# Patient Record
Sex: Male | Born: 1995 | Race: White | Hispanic: No | Marital: Married | Smoking: Never smoker
Health system: Southern US, Community
[De-identification: ages and names within clinical notes are randomized; demographics above are authoritative.]

## PROBLEM LIST (undated history)

## (undated) DIAGNOSIS — K219 Gastro-esophageal reflux disease without esophagitis: Secondary | ICD-10-CM

## (undated) DIAGNOSIS — Z973 Presence of spectacles and contact lenses: Secondary | ICD-10-CM

## (undated) HISTORY — PX: WISDOM TOOTH EXTRACTION: SHX21

---
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
## 2015-04-25 ENCOUNTER — Emergency Department
Admission: EM | Admit: 2015-04-25 | Discharge: 2015-04-26 | Disposition: A | Discharge status: Other Acute Inpatient Hospital | Payer: Medicaid Other | Attending: Emergency Medicine | Admitting: Emergency Medicine

## 2015-04-25 DIAGNOSIS — T2010XA Burn of first degree of head, face, and neck, unspecified site, initial encounter: Secondary | ICD-10-CM | POA: Diagnosis not present

## 2015-04-25 DIAGNOSIS — Y9389 Activity, other specified: Secondary | ICD-10-CM | POA: Diagnosis not present

## 2015-04-25 DIAGNOSIS — Y9289 Other specified places as the place of occurrence of the external cause: Secondary | ICD-10-CM | POA: Diagnosis not present

## 2015-04-25 DIAGNOSIS — Y998 Other external cause status: Secondary | ICD-10-CM | POA: Diagnosis not present

## 2015-04-25 DIAGNOSIS — T24232A Burn of second degree of left lower leg, initial encounter: Secondary | ICD-10-CM | POA: Insufficient documentation

## 2015-04-25 DIAGNOSIS — Z23 Encounter for immunization: Secondary | ICD-10-CM | POA: Diagnosis not present

## 2015-04-25 DIAGNOSIS — X04XXXA Exposure to ignition of highly flammable material, initial encounter: Secondary | ICD-10-CM | POA: Diagnosis not present

## 2015-04-25 DIAGNOSIS — T3110 Burns involving 10-19% of body surface with 0% to 9% third degree burns: Secondary | ICD-10-CM | POA: Insufficient documentation

## 2015-04-25 DIAGNOSIS — T22232A Burn of second degree of left upper arm, initial encounter: Secondary | ICD-10-CM | POA: Insufficient documentation

## 2015-04-25 DIAGNOSIS — T22032A Burn of unspecified degree of left upper arm, initial encounter: Secondary | ICD-10-CM | POA: Diagnosis present

## 2015-04-25 MED ORDER — ONDANSETRON HCL 4 MG/2ML IJ SOLN
4.0000 mg | Freq: Once | INTRAMUSCULAR | Status: AC
  Administered 2015-04-25: 4 mg via INTRAVENOUS

## 2015-04-25 MED ORDER — ONDANSETRON HCL 4 MG/2ML IJ SOLN
INTRAMUSCULAR | Status: AC
  Administered 2015-04-25: 4 mg via INTRAVENOUS
  Filled 2015-04-25: qty 2

## 2015-04-25 MED ORDER — TETANUS-DIPHTH-ACELL PERTUSSIS 5-2.5-18.5 LF-MCG/0.5 IM SUSP
0.5000 mL | Freq: Once | INTRAMUSCULAR | Status: AC
  Administered 2015-04-25: 0.5 mL via INTRAMUSCULAR

## 2015-04-25 MED ORDER — MORPHINE SULFATE 2 MG/ML IJ SOLN
INTRAMUSCULAR | Status: AC
  Filled 2015-04-25: qty 2

## 2015-04-25 MED ORDER — HYDROMORPHONE HCL 1 MG/ML IJ SOLN
0.5000 mg | Freq: Once | INTRAMUSCULAR | Status: AC
  Administered 2015-04-25: 0.5 mg via INTRAVENOUS

## 2015-04-25 MED ORDER — HYDROMORPHONE HCL 1 MG/ML IJ SOLN
INTRAMUSCULAR | Status: AC
  Filled 2015-04-25: qty 1

## 2015-04-25 MED ORDER — MORPHINE SULFATE 4 MG/ML IJ SOLN
4.0000 mg | Freq: Once | INTRAMUSCULAR | Status: AC
  Administered 2015-04-25: 4 mg via INTRAVENOUS

## 2015-04-25 MED ORDER — SILVER SULFADIAZINE 1 % EX CREA
TOPICAL_CREAM | CUTANEOUS | Status: AC
  Administered 2015-04-25: 1 via TOPICAL
  Filled 2015-04-25: qty 85

## 2015-04-25 MED ORDER — TETANUS-DIPHTH-ACELL PERTUSSIS 5-2.5-18.5 LF-MCG/0.5 IM SUSP
INTRAMUSCULAR | Status: AC
  Filled 2015-04-25: qty 0.5

## 2015-04-25 MED ORDER — SILVER SULFADIAZINE 1 % EX CREA
TOPICAL_CREAM | Freq: Once | CUTANEOUS | Status: AC
  Administered 2015-04-25: 1 via TOPICAL
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
## 2015-04-25 NOTE — ED Notes (Signed)
Pt now has redness to cheeks and neck. Pt states tender to touch.
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
## 2015-04-25 NOTE — ED Notes (Signed)
Informed by Juliann Pulse ed secretary, [facility] will not be able to transport pt at this time, [facility] transport from [place] coming to pick pt up.

## 2015-04-25 NOTE — ED Notes (Signed)
Pt reports he is now feeling some soreness in his throat. Acuity changed, charge RN aware.
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
## 2015-05-05 DIAGNOSIS — T2121XA Burn of second degree of chest wall, initial encounter: Secondary | ICD-10-CM | POA: Insufficient documentation

## 2015-05-05 DIAGNOSIS — T22232A Burn of second degree of left upper arm, initial encounter: Secondary | ICD-10-CM | POA: Insufficient documentation

## 2015-05-05 DIAGNOSIS — T24232A Burn of second degree of left lower leg, initial encounter: Secondary | ICD-10-CM | POA: Insufficient documentation

## 2016-02-24 ENCOUNTER — Ambulatory Visit (INDEPENDENT_AMBULATORY_CARE_PROVIDER_SITE_OTHER): Payer: Self-pay | Admitting: Family Medicine

## 2016-02-24 ENCOUNTER — Encounter: Payer: Self-pay | Admitting: Family Medicine

## 2016-02-24 VITALS — BP 110/84 | HR 86 | Ht 73.0 in | Wt 250.0 lb

## 2016-02-24 DIAGNOSIS — W57XXXA Bitten or stung by nonvenomous insect and other nonvenomous arthropods, initial encounter: Secondary | ICD-10-CM

## 2016-02-24 DIAGNOSIS — L03114 Cellulitis of left upper limb: Secondary | ICD-10-CM

## 2016-02-24 DIAGNOSIS — T148 Other injury of unspecified body region: Secondary | ICD-10-CM

## 2016-02-24 MED ORDER — SULFAMETHOXAZOLE-TRIMETHOPRIM 800-160 MG PO TABS: 1.0000 | ORAL_TABLET | Freq: Two times a day (BID) | ORAL | Status: DC

## 2016-02-24 MED ORDER — DOXYCYCLINE HYCLATE 100 MG PO TABS: 100.0000 mg | ORAL_TABLET | Freq: Two times a day (BID) | ORAL | Status: DC

## 2016-02-24 NOTE — Addendum Note (Signed)
Addended by: Fredderick Severance on: 02/24/2016 04:24 PM   Modules accepted: Orders, Medications

## 2016-02-24 NOTE — Patient Instructions (Signed)
Community-Associated MRSA CA-MRSA stands for community-associated methicillin-resistant Staphylococcus aureus. MRSA is a type of bacteria that is resistant to some common antibiotic medicines. It can cause infections in the skin and many other places in the body. Staphylococcus aureus, which is often called staph, is a bacterium that normally lives on the skin or in the nose of some people. Staph on the surface of the skin or in the nose does not cause problems. However, if the staph enters the body through a cut, wound, or break in the skin, an infection can happen. Until recently, infections with the MRSA type of staph occurred mainly in hospitals and other health care settings. Now there are increasing problems with MRSA infections in the community as well. Infections with MRSA may be very serious or even life-threatening. CAUSES  All staph bacteria, including MRSA:  Are normally harmless unless they enter the body through a scratch, cut, or wound, such as with surgery.  Can be spread from person to person by touching contaminated objects or through direct contact.  Cases of MRSA diseases in the community have been associated with:  Recent antibiotic use.  Sharing contaminated towels or clothes.  Having active skin diseases.  Participating in contact sports.  Living in crowded settings.  IV drug use.  Community-associated MRSA infections are usually skin infections, but they may cause other severe illnesses, such as:  Pneumonia.  Bone or joint infections.  Bloodstream infections (sepsis). SYMPTOMS This condition usually starts with a skin infection. Signs and symptoms may vary and can include:  An infected area of skin that is red and swollen, feels painful, and is warm to the touch.  Pus under the skin or pus draining from the infected area.  Fever. DIAGNOSIS This condition is diagnosed by cultures of fluid samples that may come from:  Swabs that are taken from cuts or  wounds in infected areas.  Nasal swabs.  Saliva or deep-cough specimens from the lungs (sputum).  Urine.  Blood. TREATMENT  Treatment varies and is based on how serious, how deep, or how extensive the infection is. For example:  Some skin infections, such as a small boil or abscess, may be treated by draining pus from the site of the infection.  Deeper or more widespread soft tissue infections are usually treated with surgery to drain pus and with antibiotics that are given through a vein or by mouth. This may be recommended even if you are pregnant.  Serious infections may require a hospital stay. If antibiotics are prescribed, they may be needed for several weeks. HOME CARE INSTRUCTIONS  Take your antibiotics as directed by your health care provider. Finish the antibiotic even if you start to feel better.  Avoid close contact with people around you as much as possible. Do not use towels, razors, toothbrushes, bedding, or other items that will be used by others.  To fight the infection, follow your health care provider's instructions for wound care. Wash your hands before and after changing your bandages (dressings).  If you have an intravascular device, such as a catheter or port, make sure that you know how to care for it. This might include:  Keeping the skin around the area clean.  Avoiding having your blood pressure taken on the side where the catheter is located.  Knowing when to report any suspected problems to your health care provider.  Be sure to tell any health care providers who care for you that you have MRSA so they are aware of your   infection. PREVENTION  Wash your hands frequently with soap and water for at least 15 seconds. If soap and water are not available, use alcohol-based hand disinfectants.  Make sure that people who live with you wash their hands often, too.  Do not share personal items. For example, avoid sharing razors and other personal hygiene  items, towels, clothing, and athletic equipment.  Wash and dry your clothes and bedding at the warmest temperatures that are recommended on the labels.  Keep wounds covered. Pus from infected sores may contain MRSA and other bacteria. Keep cuts and abrasions clean and covered with germ-free (sterile), dry bandages until they are healed.  If you have a wound that appears infected, ask your health care provider if a culture should be done for MRSA and other bacteria.  If you are breastfeeding, talk to your health care provider about MRSA. You may be asked to temporarily stop breastfeeding. SEEK IMMEDIATE MEDICAL CARE IF:  The infection appears to be getting worse. Signs include:  Increased warmth, redness, or tenderness around the wound site.  A red line that extends from the infection site.  A dark color in the area around the infection.  Wound drainage that is tan, yellow, or green.  A bad smell coming from the wound.  You feel nauseous, you vomit, or you cannot keep medicine down.  You have a fever.  You have difficulty breathing.   This information is not intended to replace advice given to you by your health care provider. Make sure you discuss any questions you have with your health care provider.   Document Released: 02/23/2006 Document Revised: 12/11/2014 Document Reviewed: 02/23/2011 Elsevier Interactive Patient Education 2016 [employer] Inc[employer]  

## 2016-02-24 NOTE — Progress Notes (Signed)
Name: Ryan Pennington   MRN: [id number]    DOB: 1996/09/17   Date:02/24/2016       Progress Note  Subjective  Chief Complaint  Chief Complaint  Patient presents with  . Insect Bite    working outside in high grass x 3 days ago- something bit or stung him- has since gotten worse by swelling and burning     Animal Bite  The incident occurred more than 2 days ago. The incident occurred at work. The pain is mild. Pertinent negatives include no chest pain, no abdominal pain, no nausea, no headaches, no neck pain, no focal weakness, no tingling and no cough.  Rash This is a new problem. The current episode started in the past 7 days. The problem has been gradually worsening since onset. The affected locations include the left arm. The rash is characterized by itchiness, redness and swelling. He was exposed to an insect bite/sting. Pertinent negatives include no cough, diarrhea, fever, joint pain, shortness of breath or sore throat. Past treatments include antibiotic cream. The treatment provided no relief.    No problem-specific assessment & plan notes found for this encounter.   History reviewed. No pertinent past medical history.  History reviewed. No pertinent past surgical history.  Family History  Problem Relation Age of Onset  . Diabetes Mother   . Diabetes Brother   . Hypertension Paternal Grandmother   . Diabetes Paternal Grandfather   . Heart disease Paternal Grandfather   . Hypertension Paternal Grandfather     Social History   Social History  . Marital Status: Single    Spouse Name: N/A  . Number of Children: N/A  . Years of Education: N/A   Occupational History  . Not on file.   Social History Main Topics  . Smoking status: Never Smoker   . Smokeless tobacco: Never Used  . Alcohol Use: No  . Drug Use: No  . Sexual Activity: No   Other Topics Concern  . Not on file   Social History Narrative    No Known Allergies   Review of Systems  Constitutional:  Negative for fever, chills, weight loss and malaise/fatigue.  HENT: Negative for ear discharge, ear pain and sore throat.   Eyes: Negative for blurred vision.  Respiratory: Negative for cough, sputum production, shortness of breath and wheezing.   Cardiovascular: Negative for chest pain, palpitations and leg swelling.  Gastrointestinal: Negative for heartburn, nausea, abdominal pain, diarrhea, constipation, blood in stool and melena.  Genitourinary: Negative for dysuria, urgency, frequency and hematuria.  Musculoskeletal: Negative for myalgias, back pain, joint pain and neck pain.  Skin: Positive for itching and rash.  Neurological: Negative for dizziness, tingling, sensory change, focal weakness and headaches.  Endo/Heme/Allergies: Negative for environmental allergies and polydipsia. Does not bruise/bleed easily.  Psychiatric/Behavioral: Negative for depression and suicidal ideas. The patient is not nervous/anxious and does not have insomnia.      Objective  Filed Vitals:   02/24/16 0758  BP: 110/84  Pulse: 86  Height: 6\' 1"  (1.854 m)  Weight: 250 lb (113.399 kg)    Physical Exam  Constitutional: He is oriented to person, place, and time and well-developed, well-nourished, and in no distress.  HENT:  Head: Normocephalic.  Right Ear: External ear normal.  Left Ear: External ear normal.  Nose: Nose normal.  Mouth/Throat: Oropharynx is clear and moist.  Eyes: Conjunctivae and EOM are normal. Pupils are equal, round, and reactive to light. Right eye exhibits no discharge. Left eye  exhibits no discharge. No scleral icterus.  Neck: Normal range of motion. Neck supple. No JVD present. No tracheal deviation present. No thyromegaly present.  Cardiovascular: Normal rate, regular rhythm, normal heart sounds and intact distal pulses.  Exam reveals no gallop and no friction rub.   No murmur heard. Pulmonary/Chest: Breath sounds normal. No respiratory distress. He has no wheezes. He has no  rales.  Abdominal: Soft. Bowel sounds are normal. He exhibits no mass. There is no hepatosplenomegaly. There is no tenderness. There is no rebound, no guarding and no CVA tenderness.  Musculoskeletal: Normal range of motion. He exhibits no tenderness.  Lymphadenopathy:    He has no cervical adenopathy.  Neurological: He is alert and oriented to person, place, and time. He has normal sensation, normal strength and intact cranial nerves. No cranial nerve deficit.  Skin: Skin is warm. Rash noted. Rash is macular and pustular. There is erythema.  Psychiatric: Mood and affect normal.  Nursing note and vitals reviewed.     Assessment & Plan  Problem List Items Addressed This Visit    None    Visit Diagnoses    Insect bite    -  Primary    Relevant Medications    sulfamethoxazole-trimethoprim (BACTRIM DS,SEPTRA DS) 800-160 MG tablet    Cellulitis of left upper extremity        Relevant Medications    sulfamethoxazole-trimethoprim (BACTRIM DS,SEPTRA DS) 800-160 MG tablet         Dr. Wilian Kwong [facility]  02/24/2016

## 2017-03-01 ENCOUNTER — Telehealth: Payer: Self-pay

## 2017-03-01 ENCOUNTER — Ambulatory Visit (INDEPENDENT_AMBULATORY_CARE_PROVIDER_SITE_OTHER): Payer: Medicaid Other | Admitting: Family Medicine

## 2017-03-01 ENCOUNTER — Encounter: Payer: Self-pay | Admitting: Family Medicine

## 2017-03-01 ENCOUNTER — Other Ambulatory Visit: Payer: Self-pay

## 2017-03-01 VITALS — BP 110/70 | HR 80 | Ht 73.0 in | Wt 264.0 lb

## 2017-03-01 DIAGNOSIS — K922 Gastrointestinal hemorrhage, unspecified: Secondary | ICD-10-CM

## 2017-03-01 DIAGNOSIS — K625 Hemorrhage of anus and rectum: Secondary | ICD-10-CM

## 2017-03-01 MED ORDER — PEG 3350-KCL-NA BICARB-NACL 420 G PO SOLR: 4000.0000 mL | Freq: Once | ORAL | 0 refills | Status: AC

## 2017-03-01 NOTE — Telephone Encounter (Signed)
Gastroenterology Pre-Procedure Review  Request Date: 03/09/17 Requesting Physician: Dr. Allen Norris  PATIENT REVIEW QUESTIONS: The patient responded to the following health history questions as indicated:    1. Are you having any GI issues? Rectal bleeding 2. Do you have a personal history of Polyps? no 3. Do you have a family history of Colon Cancer or Polyps? yes (great-grandfather colon cancer) 4. Diabetes Mellitus? no 5. Joint replacements in the past 12 months?no 6. Major health problems in the past 3 months?no 7. Any artificial heart valves, MVP, or defibrillator?no    MEDICATIONS & ALLERGIES:    Patient reports the following regarding taking any anticoagulation/antiplatelet therapy:   Plavix, Coumadin, Eliquis, Xarelto, Lovenox, Pradaxa, Brilinta, or Effient? no Aspirin? no  Patient confirms/reports the following medications:  No current outpatient prescriptions on file.   No current facility-administered medications for this visit.     Patient confirms/reports the following allergies:  Allergies  Allergen Reactions  . Sulfa Antibiotics     No orders of the defined types were placed in this encounter.   AUTHORIZATION INFORMATION Primary Insurance: 1D#: Group #:  Secondary Insurance: 1D#: Group #:  SCHEDULE INFORMATION: Date: 03/09/17 Time: Location: [place]

## 2017-03-01 NOTE — Progress Notes (Signed)
Name: Ryan Pennington   MRN: [id number]    DOB: 03-Aug-1996   Date:03/01/2017       Progress Note  Subjective  Chief Complaint  Chief Complaint  Patient presents with  . Gastroesophageal Reflux    something triggers gag reflex and he will vomit- forceful vomitting- makes him "feel weak afterwards"  . Rectal Bleeding    started approx 9 months ago- will be sitting or standing and "feel the blood come out" have noticed on toilet paper after a BM- no change in bowel habits. has happened 3 times this week    Gastroesophageal Reflux  He complains of abdominal pain. He reports no belching, no chest pain, no choking, no coughing, no dysphagia, no early satiety, no globus sensation, no heartburn, no hoarse voice, no nausea, no sore throat, no stridor, no tooth decay, no water brash or no wheezing. This is a new problem. The current episode started in the past 7 days. The problem occurs occasionally. The problem has been waxing and waning. Exacerbated by: brushing. Pertinent negatives include no anemia, fatigue, melena, muscle weakness, orthopnea or weight loss.  Rectal Bleeding   The current episode started more than 2 weeks ago (7 months ago). The onset was gradual. The problem has been unchanged. The pain is mild. The stool is described as mixed with blood. Associated symptoms include abdominal pain. Pertinent negatives include no fever, no diarrhea, no hemorrhoids, no nausea, no rectal pain, no hematuria, no chest pain, no headaches, no coughing and no rash.  Abdominal Pain  This is a chronic problem. The current episode started more than 1 month ago. The onset quality is gradual. The problem occurs intermittently. The problem has been waxing and waning. The pain is located in the LLQ. The pain is mild. The abdominal pain does not radiate. Associated symptoms include hematochezia. Pertinent negatives include no belching, constipation, diarrhea, dysuria, fever, frequency, headaches, hematuria, melena,  myalgias, nausea or weight loss. The pain is relieved by nothing. He has tried nothing for the symptoms. His past medical history is significant for GERD.    No problem-specific Assessment & Plan notes found for this encounter.   No past medical history on file.  No past surgical history on file.  Family History  Problem Relation Age of Onset  . Diabetes Mother   . Diabetes Brother   . Hypertension Paternal Grandmother   . Diabetes Paternal Grandfather   . Heart disease Paternal Grandfather   . Hypertension Paternal Grandfather     Social History   Social History  . Marital status: Single    Spouse name: N/A  . Number of children: N/A  . Years of education: N/A   Occupational History  . Not on file.   Social History Main Topics  . Smoking status: Never Smoker  . Smokeless tobacco: Never Used  . Alcohol use No  . Drug use: No  . Sexual activity: No   Other Topics Concern  . Not on file   Social History Narrative  . No narrative on file    Allergies  Allergen Reactions  . Sulfa Antibiotics     Outpatient Medications Prior to Visit  Medication Sig Dispense Refill  . doxycycline (VIBRA-TABS) 100 MG tablet Take 1 tablet (100 mg total) by mouth 2 (two) times daily. 20 tablet 0   No facility-administered medications prior to visit.     Review of Systems  Constitutional: Negative for chills, fatigue, fever, malaise/fatigue and weight loss.  HENT: Negative for  ear discharge, ear pain, hoarse voice and sore throat.   Eyes: Negative for blurred vision.  Respiratory: Negative for cough, sputum production, choking, shortness of breath and wheezing.   Cardiovascular: Negative for chest pain, palpitations and leg swelling.  Gastrointestinal: Positive for abdominal pain, blood in stool and hematochezia. Negative for constipation, diarrhea, dysphagia, heartburn, hemorrhoids, melena, nausea and rectal pain.  Genitourinary: Negative for dysuria, frequency, hematuria and  urgency.  Musculoskeletal: Negative for back pain, joint pain, myalgias, muscle weakness and neck pain.  Skin: Negative for rash.  Neurological: Negative for dizziness, tingling, sensory change, focal weakness and headaches.  Endo/Heme/Allergies: Negative for environmental allergies and polydipsia. Does not bruise/bleed easily.  Psychiatric/Behavioral: Negative for depression and suicidal ideas. The patient is not nervous/anxious and does not have insomnia.      Objective  Vitals:   03/01/17 1508  BP: 110/70  Pulse: 80  Weight: 264 lb (119.7 kg)  Height: 6\' 1"  (1.854 m)    Physical Exam  Constitutional: He is oriented to person, place, and time and well-developed, well-nourished, and in no distress.  HENT:  Head: Normocephalic.  Right Ear: External ear normal.  Left Ear: External ear normal.  Nose: Nose normal.  Mouth/Throat: Oropharynx is clear and moist.  Eyes: Conjunctivae and EOM are normal. Pupils are equal, round, and reactive to light. Right eye exhibits no discharge. Left eye exhibits no discharge. No scleral icterus.  Neck: Normal range of motion. Neck supple. No JVD present. No tracheal deviation present. No thyromegaly present.  Cardiovascular: Normal rate, regular rhythm, normal heart sounds and intact distal pulses.  Exam reveals no gallop and no friction rub.   No murmur heard. Pulmonary/Chest: Breath sounds normal. No respiratory distress. He has no wheezes. He has no rales.  Abdominal: Soft. Bowel sounds are normal. He exhibits no mass. There is no hepatosplenomegaly. There is no tenderness. There is no rebound, no guarding and no CVA tenderness.  Genitourinary: Prostate normal. Rectal exam shows guaiac positive stool.  Musculoskeletal: Normal range of motion. He exhibits no edema or tenderness.  Lymphadenopathy:    He has no cervical adenopathy.  Neurological: He is alert and oriented to person, place, and time. He has normal sensation, normal strength, normal  reflexes and intact cranial nerves. No cranial nerve deficit.  Skin: Skin is warm. No rash noted.  Psychiatric: Mood and affect normal.  Nursing note and vitals reviewed.     Assessment & Plan  Problem List Items Addressed This Visit    None    Visit Diagnoses    Lower GI bleed    -  Primary   Relevant Orders   Ambulatory referral to Gastroenterology   CBC w/Diff/Platelet      No orders of the defined types were placed in this encounter.     Dr. [facility]  03/01/17

## 2017-03-02 LAB — CBC WITH DIFFERENTIAL/PLATELET
BASOS ABS: 0 10*3/uL (ref 0.0–0.2)
Basos: 0 %
EOS (ABSOLUTE): 0.2 10*3/uL (ref 0.0–0.4)
Eos: 2 %
Hematocrit: 45.2 % (ref 37.5–51.0)
Hemoglobin: 15.6 g/dL (ref 13.0–17.7)
IMMATURE GRANS (ABS): 0.1 10*3/uL (ref 0.0–0.1)
Immature Granulocytes: 1 %
LYMPHS: 34 %
Lymphocytes Absolute: 3.6 10*3/uL — ABNORMAL HIGH (ref 0.7–3.1)
MCH: 29.3 pg (ref 26.6–33.0)
MCHC: 34.5 g/dL (ref 31.5–35.7)
MCV: 85 fL (ref 79–97)
MONOS ABS: 0.8 10*3/uL (ref 0.1–0.9)
Monocytes: 7 %
NEUTROS ABS: 6 10*3/uL (ref 1.4–7.0)
NEUTROS PCT: 56 %
PLATELETS: 277 10*3/uL (ref 150–379)
RBC: 5.32 x10E6/uL (ref 4.14–5.80)
RDW: 13 % (ref 12.3–15.4)
WBC: 10.8 10*3/uL (ref 3.4–10.8)

## 2017-03-05 ENCOUNTER — Encounter: Payer: Self-pay | Admitting: *Deleted

## 2017-03-05 NOTE — Discharge Instructions (Signed)

## 2017-03-09 ENCOUNTER — Ambulatory Visit: Payer: Medicaid Other | Admitting: Anesthesiology

## 2017-03-09 ENCOUNTER — Ambulatory Visit
Admission: RE | Admit: 2017-03-09 | Discharge: 2017-03-09 | Disposition: A | Discharge status: Home / Self Care | Payer: Medicaid Other | Source: Ambulatory Visit | Attending: Gastroenterology | Admitting: Gastroenterology

## 2017-03-09 ENCOUNTER — Encounter
Admission: RE | Disposition: A | Discharge status: Home / Self Care | Payer: Self-pay | Source: Ambulatory Visit | Attending: Gastroenterology

## 2017-03-09 DIAGNOSIS — K921 Melena: Secondary | ICD-10-CM

## 2017-03-09 DIAGNOSIS — K219 Gastro-esophageal reflux disease without esophagitis: Secondary | ICD-10-CM | POA: Insufficient documentation

## 2017-03-09 DIAGNOSIS — K640 First degree hemorrhoids: Secondary | ICD-10-CM | POA: Diagnosis not present

## 2017-03-09 DIAGNOSIS — K602 Anal fissure, unspecified: Secondary | ICD-10-CM | POA: Diagnosis not present

## 2017-03-09 HISTORY — DX: Gastro-esophageal reflux disease without esophagitis: K21.9

## 2017-03-09 HISTORY — PX: COLONOSCOPY WITH PROPOFOL: SHX5780

## 2017-03-09 HISTORY — DX: Presence of spectacles and contact lenses: Z97.3

## 2017-03-09 SURGERY — COLONOSCOPY WITH PROPOFOL
Anesthesia: Monitor Anesthesia Care | Wound class: Contaminated

## 2017-03-09 MED ORDER — STERILE WATER FOR IRRIGATION IR SOLN
Status: DC | PRN
  Administered 2017-03-09: 08:00:00

## 2017-03-09 MED ORDER — OXYCODONE HCL 5 MG PO TABS: 5.0000 mg | ORAL_TABLET | Freq: Once | ORAL | Status: DC | PRN

## 2017-03-09 MED ORDER — LIDOCAINE HCL (CARDIAC) 20 MG/ML IV SOLN
INTRAVENOUS | Status: DC | PRN
  Administered 2017-03-09: 40 mg via INTRAVENOUS

## 2017-03-09 MED ORDER — HYDROCORTISONE ACETATE 25 MG RE SUPP: 25.0000 mg | Freq: Every day | RECTAL | 0 refills | Status: AC

## 2017-03-09 MED ORDER — ACETAMINOPHEN 160 MG/5ML PO SOLN: 325.0000 mg | ORAL | Status: DC | PRN

## 2017-03-09 MED ORDER — DEXAMETHASONE SODIUM PHOSPHATE 4 MG/ML IJ SOLN: 8.0000 mg | Freq: Once | INTRAMUSCULAR | Status: DC | PRN

## 2017-03-09 MED ORDER — LACTATED RINGERS IV SOLN
INTRAVENOUS | Status: DC
  Administered 2017-03-09: 07:00:00 via INTRAVENOUS

## 2017-03-09 MED ORDER — LACTATED RINGERS IV SOLN
500.0000 mL | INTRAVENOUS | Status: DC
Start: 2017-03-09 — End: 2017-03-09

## 2017-03-09 MED ORDER — PROPOFOL 10 MG/ML IV BOLUS
INTRAVENOUS | Status: DC | PRN
  Administered 2017-03-09: 50 mg via INTRAVENOUS
  Administered 2017-03-09: 100 mg via INTRAVENOUS
  Administered 2017-03-09 (×3): 50 mg via INTRAVENOUS
  Administered 2017-03-09: 20 mg via INTRAVENOUS
  Administered 2017-03-09: 50 mg via INTRAVENOUS

## 2017-03-09 MED ORDER — OXYCODONE HCL 5 MG/5ML PO SOLN: 5.0000 mg | Freq: Once | ORAL | Status: DC | PRN

## 2017-03-09 MED ORDER — ACETAMINOPHEN 325 MG PO TABS: 325.0000 mg | ORAL_TABLET | ORAL | Status: DC | PRN

## 2017-03-09 MED ORDER — FENTANYL CITRATE (PF) 100 MCG/2ML IJ SOLN: 25.0000 ug | INTRAMUSCULAR | Status: DC | PRN

## 2017-03-09 SURGICAL SUPPLY — 23 items

## 2017-03-09 NOTE — Anesthesia Postprocedure Evaluation (Signed)
Anesthesia Post Note  Patient: Ryan Pennington  Procedure(s) Performed: Procedure(s) (LRB): COLONOSCOPY WITH PROPOFOL (N/A)  Patient location during evaluation: PACU Anesthesia Type: MAC Level of consciousness: awake and alert Pain management: pain level controlled Vital Signs Assessment: post-procedure vital signs reviewed and stable Respiratory status: spontaneous breathing, nonlabored ventilation and respiratory function stable Cardiovascular status: stable and blood pressure returned to baseline Anesthetic complications: no    [name]

## 2017-03-09 NOTE — H&P (Signed)
  Lucilla Lame, MD [address], [place] [address] Phone:[phone] Fax : [phone]  Primary Care Physician:  Otilio Miu, MD Primary Gastroenterologist:  Dr. Allen Norris  Pre-Procedure History & Physical: HPI:  Ryan Pennington is a 21 y.o. male is here for an colonoscopy.   Past Medical History:  Diagnosis Date  . GERD (gastroesophageal reflux disease)   . Wears contact lenses     Past Surgical History:  Procedure Laterality Date  . WISDOM TOOTH EXTRACTION      Prior to Admission medications   Not on File    Allergies as of 03/01/2017 - Review Complete 03/01/2017  Allergen Reaction Noted  . Sulfa antibiotics  02/24/2016    Family History  Problem Relation Age of Onset  . Diabetes Mother   . Diabetes Brother   . Hypertension Paternal Grandmother   . Diabetes Paternal Grandfather   . Heart disease Paternal Grandfather   . Hypertension Paternal Grandfather     Social History   Social History  . Marital status: Single    Spouse name: N/A  . Number of children: N/A  . Years of education: N/A   Occupational History  . Not on file.   Social History Main Topics  . Smoking status: Never Smoker  . Smokeless tobacco: Never Used  . Alcohol use No  . Drug use: No  . Sexual activity: No   Other Topics Concern  . Not on file   Social History Narrative  . No narrative on file    Review of Systems: See HPI, otherwise negative ROS  Physical Exam: BP 134/83   Pulse 79   Temp 97.9 F (36.6 C) (Temporal)   Resp 16   Ht 6\' 1"  (1.854 m)   Wt 254 lb (115.2 kg)   SpO2 100%   BMI 33.51 kg/m  General:   Alert,  pleasant and cooperative in NAD Head:  Normocephalic and atraumatic. Neck:  Supple; no masses or thyromegaly. Lungs:  Clear throughout to auscultation.    Heart:  Regular rate and rhythm. Abdomen:  Soft, nontender and nondistended. Normal bowel sounds, without guarding, and without rebound.   Neurologic:  Alert and  oriented x4;   grossly normal neurologically.  Impression/Plan: Ryan Pennington is here for an colonoscopy to be performed for hematochezia  Risks, benefits, limitations, and alternatives regarding  colonoscopy have been reviewed with the patient.  Questions have been answered.  All parties agreeable.   Lucilla Lame, MD  03/09/2017, 7:28 AM

## 2017-03-09 NOTE — Anesthesia Procedure Notes (Signed)
Procedure Name: MAC Date/Time: 03/09/2017 7:33 AM Performed by: Janna Arch Pre-anesthesia Checklist: Patient identified, Emergency Drugs available, Suction available and Patient being monitored Patient Re-evaluated:Patient Re-evaluated prior to inductionOxygen Delivery Method: Nasal cannula

## 2017-03-09 NOTE — Op Note (Signed)
[facility] Gastroenterology Patient Name: Ryan Pennington Procedure Date: 03/09/2017 7:23 AM MRN: [id number] Account #: [id number] Date of Birth: 1996-06-27 Admit Type: Outpatient Age: 21 Room: [facility] OR ROOM 01 Gender: Male Note Status: Finalized Procedure:            Colonoscopy Indications:          Hematochezia Providers:            Lucilla Lame MD, MD Referring MD:         Juline Patch, MD (Referring MD) Medicines:            Propofol per Anesthesia Complications:        No immediate complications. Procedure:            Pre-Anesthesia Assessment:                       - Prior to the procedure, a History and Physical was                        performed, and patient medications and allergies were                        reviewed. The patient's tolerance of previous                        anesthesia was also reviewed. The risks and benefits of                        the procedure and the sedation options and risks were                        discussed with the patient. All questions were                        answered, and informed consent was obtained. Prior                        Anticoagulants: The patient has taken no previous                        anticoagulant or antiplatelet agents. ASA Grade                        Assessment: II - A patient with mild systemic disease.                        After reviewing the risks and benefits, the patient was                        deemed in satisfactory condition to undergo the                        procedure.                       After obtaining informed consent, the colonoscope was                        passed under direct vision. Throughout the procedure,  the patient's blood pressure, pulse, and oxygen                        saturations were monitored continuously. The Olympus CF                        H180AL Colonoscope (S#: [id number]) was introduced through                        the  anus and advanced to the the cecum, identified by                        appendiceal orifice and ileocecal valve. The                        colonoscopy was performed without difficulty. The                        patient tolerated the procedure well. The quality of                        the bowel preparation was excellent. Findings:      The perianal exam findings include anal fissure.      Non-bleeding internal hemorrhoids were found during retroflexion. The       hemorrhoids were Grade I (internal hemorrhoids that do not prolapse).      The terminal ileum appeared normal. Impression:           - Anal fissure found on perianal exam.                       - Non-bleeding internal hemorrhoids.                       - The examined portion of the ileum was normal.                       - No specimens collected. Recommendation:       - Discharge patient to home.                       - Resume previous diet.                       - Continue present medications.                       - Use hydrocortisone suppository 25 mg 1 per rectum                        once a day. Procedure Code(s):    --- Professional ---                       [phone], Colonoscopy, flexible; diagnostic, including                        collection of specimen(s) by brushing or washing, when                        performed (separate procedure) Diagnosis Code(s):    --- Professional ---  K92.1, Melena (includes Hematochezia)                       K60.2, Anal fissure, unspecified CPT copyright 2016 American Medical Association. All rights reserved. The codes documented in this report are preliminary and upon coder review may  be revised to meet current compliance requirements. Lucilla Lame MD, MD 03/09/2017 7:51:37 AM This report has been signed electronically. Number of Addenda: 0 Note Initiated On: 03/09/2017 7:23 AM Scope Withdrawal Time: 0 hours 9 minutes 35 seconds  Total Procedure Duration: 0  hours 11 minutes 6 seconds       [facility]

## 2017-03-09 NOTE — Transfer of Care (Signed)
Immediate Anesthesia Transfer of Care Note  Patient: Ryan Pennington  Procedure(s) Performed: Procedure(s): COLONOSCOPY WITH PROPOFOL (N/A)  Patient Location: PACU  Anesthesia Type: MAC  Level of Consciousness: awake, alert  and patient cooperative  Airway and Oxygen Therapy: Patient Spontanous Breathing and Patient connected to supplemental oxygen  Post-op Assessment: Post-op Vital signs reviewed, Patient's Cardiovascular Status Stable, Respiratory Function Stable, Patent Airway and No signs of Nausea or vomiting  Post-op Vital Signs: Reviewed and stable  Complications: No apparent anesthesia complications

## 2017-03-09 NOTE — Anesthesia Preprocedure Evaluation (Signed)
Anesthesia Evaluation  Patient identified by MRN, date of birth, ID band Patient awake    Reviewed: Allergy & Precautions, H&P , NPO status , Patient's Chart, lab work & pertinent test results, reviewed documented beta blocker date and time   Airway Mallampati: III  TM Distance: >3 FB Neck ROM: full    Dental no notable dental hx.    Pulmonary neg pulmonary ROS,    Pulmonary exam normal breath sounds clear to auscultation       Cardiovascular Exercise Tolerance: Good negative cardio ROS   Rhythm:regular Rate:Normal     Neuro/Psych negative neurological ROS  negative psych ROS   GI/Hepatic Neg liver ROS, GERD  Medicated,  Endo/Other  negative endocrine ROS  Renal/GU negative Renal ROS  negative genitourinary   Musculoskeletal   Abdominal   Peds  Hematology negative hematology ROS (+)   Anesthesia Other Findings   Reproductive/Obstetrics negative OB ROS                             Anesthesia Physical Anesthesia Plan  ASA: II  Anesthesia Plan: MAC   Post-op Pain Management:    Induction:   Airway Management Planned:   Additional Equipment:   Intra-op Plan:   Post-operative Plan:   Informed Consent: I have reviewed the patients History and Physical, chart, labs and discussed the procedure including the risks, benefits and alternatives for the proposed anesthesia with the patient or authorized representative who has indicated his/her understanding and acceptance.     Plan Discussed with: CRNA  Anesthesia Plan Comments:         Anesthesia Quick Evaluation

## 2017-03-12 ENCOUNTER — Encounter: Payer: Self-pay | Admitting: Gastroenterology

## 2017-03-20 ENCOUNTER — Other Ambulatory Visit: Payer: Self-pay

## 2017-09-14 ENCOUNTER — Ambulatory Visit (INDEPENDENT_AMBULATORY_CARE_PROVIDER_SITE_OTHER): Payer: PRIVATE HEALTH INSURANCE

## 2017-09-14 DIAGNOSIS — Z23 Encounter for immunization: Secondary | ICD-10-CM | POA: Diagnosis not present

## 2017-12-12 ENCOUNTER — Ambulatory Visit
Admission: RE | Admit: 2017-12-12 | Discharge: 2017-12-12 | Disposition: A | Discharge status: Home / Self Care | Payer: BLUE CROSS/BLUE SHIELD | Source: Ambulatory Visit | Attending: Family Medicine | Admitting: Family Medicine

## 2017-12-12 ENCOUNTER — Other Ambulatory Visit: Payer: Self-pay | Admitting: Family Medicine

## 2017-12-12 DIAGNOSIS — S6991XA Unspecified injury of right wrist, hand and finger(s), initial encounter: Secondary | ICD-10-CM | POA: Insufficient documentation

## 2017-12-12 DIAGNOSIS — T1490XA Injury, unspecified, initial encounter: Secondary | ICD-10-CM

## 2017-12-12 DIAGNOSIS — W109XXA Fall (on) (from) unspecified stairs and steps, initial encounter: Secondary | ICD-10-CM | POA: Insufficient documentation

## 2017-12-12 DIAGNOSIS — W19XXXA Unspecified fall, initial encounter: Secondary | ICD-10-CM

## 2018-02-12 ENCOUNTER — Ambulatory Visit
Admission: EM | Admit: 2018-02-12 | Discharge: 2018-02-12 | Disposition: A | Discharge status: Home / Self Care | Payer: BLUE CROSS/BLUE SHIELD | Attending: Family Medicine | Admitting: Family Medicine

## 2018-02-12 ENCOUNTER — Other Ambulatory Visit: Payer: Self-pay

## 2018-02-12 DIAGNOSIS — S61412A Laceration without foreign body of left hand, initial encounter: Secondary | ICD-10-CM | POA: Diagnosis not present

## 2018-02-12 DIAGNOSIS — W268XXA Contact with other sharp object(s), not elsewhere classified, initial encounter: Secondary | ICD-10-CM | POA: Diagnosis not present
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
## 2018-10-17 ENCOUNTER — Telehealth: Payer: Self-pay

## 2018-10-17 ENCOUNTER — Other Ambulatory Visit: Payer: Self-pay

## 2018-10-17 MED ORDER — AMOXICILLIN 500 MG PO CAPS: 500.0000 mg | ORAL_CAPSULE | Freq: Three times a day (TID) | ORAL | 0 refills | Status: DC

## 2018-10-17 NOTE — Telephone Encounter (Signed)
Pt will come in on Monday- sent in Amox to [facility]

## 2018-10-21 ENCOUNTER — Ambulatory Visit: Payer: BLUE CROSS/BLUE SHIELD | Admitting: Family Medicine

## 2018-10-21 ENCOUNTER — Encounter: Payer: Self-pay | Admitting: Family Medicine

## 2018-10-21 VITALS — BP 136/76 | HR 80 | Ht 73.0 in | Wt 260.0 lb

## 2018-10-21 DIAGNOSIS — L600 Ingrowing nail: Secondary | ICD-10-CM | POA: Diagnosis not present
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
## 2019-01-28 DIAGNOSIS — L259 Unspecified contact dermatitis, unspecified cause: Secondary | ICD-10-CM | POA: Diagnosis not present

## 2019-01-28 DIAGNOSIS — H00012 Hordeolum externum right lower eyelid: Secondary | ICD-10-CM | POA: Diagnosis not present

## 2019-06-20 DIAGNOSIS — L03032 Cellulitis of left toe: Secondary | ICD-10-CM | POA: Diagnosis not present

## 2019-06-20 DIAGNOSIS — L03031 Cellulitis of right toe: Secondary | ICD-10-CM | POA: Diagnosis not present

## 2019-10-24 ENCOUNTER — Other Ambulatory Visit: Payer: Self-pay

## 2019-10-24 ENCOUNTER — Encounter: Payer: Self-pay | Admitting: Family Medicine

## 2019-10-24 ENCOUNTER — Ambulatory Visit (INDEPENDENT_AMBULATORY_CARE_PROVIDER_SITE_OTHER): Payer: BC Managed Care – PPO | Admitting: Family Medicine

## 2019-10-24 VITALS — BP 132/68 | HR 80 | Ht 73.0 in | Wt 283.0 lb

## 2019-10-24 DIAGNOSIS — I10 Essential (primary) hypertension: Secondary | ICD-10-CM | POA: Diagnosis not present

## 2019-10-24 MED ORDER — LISINOPRIL 10 MG PO TABS: 10.0000 mg | ORAL_TABLET | Freq: Every day | ORAL | 1 refills | Status: DC
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
## 2019-10-24 NOTE — Patient Instructions (Signed)

## 2019-11-21 ENCOUNTER — Other Ambulatory Visit: Payer: Self-pay | Admitting: Family Medicine

## 2019-11-21 DIAGNOSIS — I10 Essential (primary) hypertension: Secondary | ICD-10-CM

## 2020-04-23 ENCOUNTER — Ambulatory Visit (INDEPENDENT_AMBULATORY_CARE_PROVIDER_SITE_OTHER): Payer: BC Managed Care – PPO | Admitting: Family Medicine

## 2020-04-23 ENCOUNTER — Other Ambulatory Visit: Payer: Self-pay

## 2020-04-23 ENCOUNTER — Encounter: Payer: Self-pay | Admitting: Family Medicine

## 2020-04-23 VITALS — BP 128/68 | HR 77 | Ht 73.0 in | Wt 295.0 lb

## 2020-04-23 DIAGNOSIS — I10 Essential (primary) hypertension: Secondary | ICD-10-CM | POA: Diagnosis not present

## 2020-04-23 MED ORDER — LISINOPRIL 10 MG PO TABS: 10.0000 mg | ORAL_TABLET | Freq: Every day | ORAL | 1 refills | Status: DC
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
## 2020-04-24 LAB — RENAL FUNCTION PANEL
Albumin: 4.6 g/dL (ref 4.1–5.2)
BUN/Creatinine Ratio: 14 (ref 9–20)
BUN: 16 mg/dL (ref 6–20)
CO2: 23 mmol/L (ref 20–29)
Calcium: 9.5 mg/dL (ref 8.7–10.2)
Chloride: 100 mmol/L (ref 96–106)
Creatinine, Ser: 1.16 mg/dL (ref 0.76–1.27)
GFR calc Af Amer: 102 mL/min/{1.73_m2} (ref >59)
GFR calc non Af Amer: 88 mL/min/{1.73_m2} (ref >59)
Glucose: 94 mg/dL (ref 65–99)
Phosphorus: 3.6 mg/dL (ref 2.8–4.1)
Potassium: 4.5 mmol/L (ref 3.5–5.2)
Sodium: 137 mmol/L (ref 134–144)

## 2020-07-01 ENCOUNTER — Ambulatory Visit: Payer: Self-pay | Admitting: Family Medicine

## 2020-07-16 ENCOUNTER — Encounter: Payer: Self-pay | Admitting: Emergency Medicine

## 2020-07-16 ENCOUNTER — Ambulatory Visit
Admission: EM | Admit: 2020-07-16 | Discharge: 2020-07-16 | Disposition: A | Discharge status: Home / Self Care | Payer: BC Managed Care – PPO | Attending: Internal Medicine | Admitting: Internal Medicine

## 2020-07-16 ENCOUNTER — Other Ambulatory Visit: Payer: Self-pay

## 2020-07-16 DIAGNOSIS — R05 Cough: Secondary | ICD-10-CM

## 2020-07-16 DIAGNOSIS — U071 COVID-19: Secondary | ICD-10-CM | POA: Insufficient documentation

## 2020-07-16 DIAGNOSIS — Z20822 Contact with and (suspected) exposure to covid-19: Secondary | ICD-10-CM

## 2020-07-16 DIAGNOSIS — R058 Other specified cough: Secondary | ICD-10-CM

## 2020-07-16 MED ORDER — IBUPROFEN 600 MG PO TABS: 600.0000 mg | ORAL_TABLET | Freq: Four times a day (QID) | ORAL | 0 refills | Status: DC | PRN

## 2020-07-16 MED ORDER — BENZONATATE 100 MG PO CAPS: 100.0000 mg | ORAL_CAPSULE | Freq: Three times a day (TID) | ORAL | 0 refills | Status: DC | PRN

## 2020-07-16 NOTE — Discharge Instructions (Signed)
Please quarantine until COVID-19 test results are available Increase hydration Tylenol/Motrin as needed for pain If you start developing worsening shortness of breath, fever, persistent vomiting or changes in mentation please return to urgent care to be reevaluated.

## 2020-07-16 NOTE — ED Triage Notes (Signed)
Patient c/o cough, congestion, chills, HAs, that started on Monday and loss of taste and smell that started today.  Patient denies fevers.  Patient states that his wife tested positive for COVID today.

## 2020-07-17 LAB — SARS CORONAVIRUS 2 (TAT 6-24 HRS): SARS Coronavirus 2: POSITIVE — AB
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
## 2020-07-23 DIAGNOSIS — U071 COVID-19: Secondary | ICD-10-CM | POA: Diagnosis not present

## 2021-07-08 ENCOUNTER — Ambulatory Visit (INDEPENDENT_AMBULATORY_CARE_PROVIDER_SITE_OTHER): Payer: BC Managed Care – PPO | Admitting: Family Medicine

## 2021-07-08 ENCOUNTER — Other Ambulatory Visit: Payer: Self-pay

## 2021-07-08 ENCOUNTER — Other Ambulatory Visit
Admission: RE | Admit: 2021-07-08 | Discharge: 2021-07-08 | Disposition: A | Discharge status: Home / Self Care | Payer: BC Managed Care – PPO | Attending: Family Medicine | Admitting: Family Medicine

## 2021-07-08 ENCOUNTER — Encounter: Payer: Self-pay | Admitting: Family Medicine

## 2021-07-08 VITALS — BP 102/62 | HR 68 | Ht 73.0 in | Wt 279.0 lb

## 2021-07-08 DIAGNOSIS — M1990 Unspecified osteoarthritis, unspecified site: Secondary | ICD-10-CM | POA: Diagnosis not present

## 2021-07-08 DIAGNOSIS — Z6836 Body mass index (BMI) 36.0-36.9, adult: Secondary | ICD-10-CM | POA: Insufficient documentation

## 2021-07-08 DIAGNOSIS — I10 Essential (primary) hypertension: Secondary | ICD-10-CM

## 2021-07-08 DIAGNOSIS — E669 Obesity, unspecified: Secondary | ICD-10-CM | POA: Diagnosis not present

## 2021-07-08 DIAGNOSIS — R69 Illness, unspecified: Secondary | ICD-10-CM

## 2021-07-08 DIAGNOSIS — Z79899 Other long term (current) drug therapy: Secondary | ICD-10-CM | POA: Diagnosis not present

## 2021-07-08 LAB — RENAL FUNCTION PANEL
Albumin: 4.9 g/dL (ref 3.5–5.0)
Anion gap: 6 (ref 5–15)
BUN: 18 mg/dL (ref 6–20)
CO2: 28 mmol/L (ref 22–32)
Calcium: 9.2 mg/dL (ref 8.9–10.3)
Chloride: 98 mmol/L (ref 98–111)
Creatinine, Ser: 0.93 mg/dL (ref 0.61–1.24)
GFR, Estimated: >60 mL/min (ref >60)
Glucose, Bld: 101 mg/dL — ABNORMAL HIGH (ref 70–99)
Phosphorus: 3.7 mg/dL (ref 2.5–4.6)
Potassium: 4.6 mmol/L (ref 3.5–5.1)
Sodium: 132 mmol/L — ABNORMAL LOW (ref 135–145)

## 2021-07-08 LAB — LIPID PANEL
Cholesterol: 140 mg/dL (ref 0–200)
HDL: 47 mg/dL (ref >40)
LDL Cholesterol: 83 mg/dL (ref 0–99)
Total CHOL/HDL Ratio: 3 RATIO
Triglycerides: 52 mg/dL (ref <150)
VLDL: 10 mg/dL (ref 0–40)

## 2021-07-08 MED ORDER — LISINOPRIL 5 MG PO TABS: 5.0000 mg | ORAL_TABLET | Freq: Every day | ORAL | 1 refills | Status: DC

## 2021-07-08 MED ORDER — MELOXICAM 7.5 MG PO TABS: 7.5000 mg | ORAL_TABLET | Freq: Every day | ORAL | 2 refills | Status: AC

## 2021-07-08 NOTE — Patient Instructions (Signed)
https://www.nhlbi.nih.gov/files/docs/public/heart/dash_brief.pdf">  DASH Eating Plan DASH stands for Dietary Approaches to Stop Hypertension. The DASH eating plan is a healthy eating plan that has been shown to: Reduce high blood pressure (hypertension). Reduce your risk for type 2 diabetes, heart disease, and stroke. Help with weight loss. What are tips for following this plan? Reading food labels Check food labels for the amount of salt (sodium) per serving. Choose foods with less than 5 percent of the Daily Value of sodium. Generally, foods with less than 300 milligrams (mg) of sodium per serving fit into this eating plan. To find whole grains, look for the word "whole" as the first word in the ingredient list. Shopping Buy products labeled as "low-sodium" or "no salt added." Buy fresh foods. Avoid canned foods and pre-made or frozen meals. Cooking Avoid adding salt when cooking. Use salt-free seasonings or herbs instead of table salt or sea salt. Check with your health care provider or pharmacist before using salt substitutes. Do not fry foods. Cook foods using healthy methods such as baking, boiling, grilling, roasting, and broiling instead. Cook with heart-healthy oils, such as olive, canola, avocado, soybean, or sunflower oil. Meal planning  Eat a balanced diet that includes: 4 or more servings of fruits and 4 or more servings of vegetables each day. Try to fill one-half of your plate with fruits and vegetables. 6-8 servings of whole grains each day. Less than 6 oz (170 g) of lean meat, poultry, or fish each day. A 3-oz (85-g) serving of meat is about the same size as a deck of cards. One egg equals 1 oz (28 g). 2-3 servings of low-fat dairy each day. One serving is 1 cup (237 mL). 1 serving of nuts, seeds, or beans 5 times each week. 2-3 servings of heart-healthy fats. Healthy fats called omega-3 fatty acids are found in foods such as walnuts, flaxseeds, fortified milks, and eggs.  These fats are also found in cold-water fish, such as sardines, salmon, and mackerel. Limit how much you eat of: Canned or prepackaged foods. Food that is high in trans fat, such as some fried foods. Food that is high in saturated fat, such as fatty meat. Desserts and other sweets, sugary drinks, and other foods with added sugar. Full-fat dairy products. Do not salt foods before eating. Do not eat more than 4 egg yolks a week. Try to eat at least 2 vegetarian meals a week. Eat more home-cooked food and less restaurant, buffet, and fast food.  Lifestyle When eating at a restaurant, ask that your food be prepared with less salt or no salt, if possible. If you drink alcohol: Limit how much you use to: 0-1 drink a day for women who are not pregnant. 0-2 drinks a day for men. Be aware of how much alcohol is in your drink. In the [place] one drink equals one 12 oz bottle of beer (355 mL), one 5 oz glass of wine (148 mL), or one 1 oz glass of hard liquor (44 mL). General information Avoid eating more than 2,300 mg of salt a day. If you have hypertension, you may need to reduce your sodium intake to 1,500 mg a day. Work with your health care provider to maintain a healthy body weight or to lose weight. Ask what an ideal weight is for you. Get at least 30 minutes of exercise that causes your heart to beat faster (aerobic exercise) most days of the week. Activities may include walking, swimming, or biking. Work with your health care provider   or dietitian to adjust your eating plan to your individual calorie needs. What foods should I eat? Fruits All fresh, dried, or frozen fruit. Canned fruit in natural juice (without addedsugar). Vegetables Fresh or frozen vegetables (raw, steamed, roasted, or grilled). Low-sodium or reduced-sodium tomato and vegetable juice. Low-sodium or reduced-sodium tomatosauce and tomato paste. Low-sodium or reduced-sodium canned vegetables. Grains Whole-grain or  whole-wheat bread. Whole-grain or whole-wheat pasta. Brown rice. Oatmeal. Quinoa. Bulgur. Whole-grain and low-sodium cereals. Pita bread.Low-fat, low-sodium crackers. Whole-wheat flour tortillas. Meats and other proteins Skinless chicken or turkey. Ground chicken or turkey. Pork with fat trimmed off. Fish and seafood. Egg whites. Dried beans, peas, or lentils. Unsalted nuts, nut butters, and seeds. Unsalted canned beans. Lean cuts of beef with fat trimmed off. Low-sodium, lean precooked or cured meat, such as sausages or meatloaves. Dairy Low-fat (1%) or fat-free (skim) milk. Reduced-fat, low-fat, or fat-free cheeses. Nonfat, low-sodium ricotta or cottage cheese. Low-fat or nonfatyogurt. Low-fat, low-sodium cheese. Fats and oils Soft margarine without trans fats. Vegetable oil. Reduced-fat, low-fat, or light mayonnaise and salad dressings (reduced-sodium). Canola, safflower, olive, avocado, soybean, andsunflower oils. Avocado. Seasonings and condiments Herbs. Spices. Seasoning mixes without salt. Other foods Unsalted popcorn and pretzels. Fat-free sweets. The items listed above may not be a complete list of foods and beverages you can eat. Contact a dietitian for more information. What foods should I avoid? Fruits Canned fruit in a light or heavy syrup. Fried fruit. Fruit in cream or buttersauce. Vegetables Creamed or fried vegetables. Vegetables in a cheese sauce. Regular canned vegetables (not low-sodium or reduced-sodium). Regular canned tomato sauce and paste (not low-sodium or reduced-sodium). Regular tomato and vegetable juice(not low-sodium or reduced-sodium). Pickles. Olives. Grains Baked goods made with fat, such as croissants, muffins, or some breads. Drypasta or rice meal packs. Meats and other proteins Fatty cuts of meat. Ribs. Fried meat. Bacon. Bologna, salami, and other precooked or cured meats, such as sausages or meat loaves. Fat from the back of a pig (fatback). Bratwurst.  Salted nuts and seeds. Canned beans with added salt. Canned orsmoked fish. Whole eggs or egg yolks. Chicken or turkey with skin. Dairy Whole or 2% milk, cream, and half-and-half. Whole or full-fat cream cheese. Whole-fat or sweetened yogurt. Full-fat cheese. Nondairy creamers. Whippedtoppings. Processed cheese and cheese spreads. Fats and oils Butter. Stick margarine. Lard. Shortening. Ghee. Bacon fat. Tropical oils, suchas coconut, palm kernel, or palm oil. Seasonings and condiments Onion salt, garlic salt, seasoned salt, table salt, and sea salt. Worcestershire sauce. Tartar sauce. Barbecue sauce. Teriyaki sauce. Soy sauce, including reduced-sodium. Steak sauce. Canned and packaged gravies. Fish sauce. Oyster sauce. Cocktail sauce. Store-bought horseradish. Ketchup. Mustard. Meat flavorings and tenderizers. Bouillon cubes. Hot sauces. Pre-made or packaged marinades. Pre-made or packaged taco seasonings. Relishes. Regular saladdressings. Other foods Salted popcorn and pretzels. The items listed above may not be a complete list of foods and beverages you should avoid. Contact a dietitian for more information. Where to find more information National Heart, Lung, and Blood Institute: www.nhlbi.nih.gov American Heart Association: www.heart.org Academy of Nutrition and Dietetics: www.eatright.org National Kidney Foundation: www.kidney.org Summary The DASH eating plan is a healthy eating plan that has been shown to reduce high blood pressure (hypertension). It may also reduce your risk for type 2 diabetes, heart disease, and stroke. When on the DASH eating plan, aim to eat more fresh fruits and vegetables, whole grains, lean proteins, low-fat dairy, and heart-healthy fats. With the DASH eating plan, you should limit salt (sodium) intake to 2,300   mg a day. If you have hypertension, you may need to reduce your sodium intake to 1,500 mg a day. Work with your health care provider or dietitian to adjust  your eating plan to your individual calorie needs. This information is not intended to replace advice given to you by your health care provider. Make sure you discuss any questions you have with your healthcare provider. Document Revised: 10/24/2019 Document Reviewed: 10/24/2019 Elsevier Patient Education  2022 Elsevier Inc.  
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
## 2021-07-26 ENCOUNTER — Encounter: Payer: Self-pay | Admitting: Family Medicine

## 2021-07-26 ENCOUNTER — Ambulatory Visit (INDEPENDENT_AMBULATORY_CARE_PROVIDER_SITE_OTHER): Payer: BC Managed Care – PPO | Admitting: Family Medicine

## 2021-07-26 ENCOUNTER — Other Ambulatory Visit: Payer: Self-pay

## 2021-07-26 VITALS — BP 120/70 | HR 76 | Ht 73.0 in | Wt 276.0 lb

## 2021-07-26 DIAGNOSIS — H1033 Unspecified acute conjunctivitis, bilateral: Secondary | ICD-10-CM | POA: Diagnosis not present

## 2021-07-26 MED ORDER — OFLOXACIN 0.3 % OP SOLN: 1.0000 [drp] | Freq: Four times a day (QID) | OPHTHALMIC | 0 refills | Status: DC
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
## 2021-09-12 ENCOUNTER — Encounter: Payer: Self-pay | Admitting: Family Medicine

## 2021-09-12 ENCOUNTER — Ambulatory Visit (INDEPENDENT_AMBULATORY_CARE_PROVIDER_SITE_OTHER): Payer: BC Managed Care – PPO | Admitting: Family Medicine

## 2021-09-12 VITALS — Temp 97.0°F | Ht 73.0 in | Wt 279.0 lb

## 2021-09-12 DIAGNOSIS — U071 COVID-19: Secondary | ICD-10-CM | POA: Diagnosis not present

## 2021-09-12 MED ORDER — MOLNUPIRAVIR EUA 200MG CAPSULE: 4.0000 | ORAL_CAPSULE | Freq: Two times a day (BID) | ORAL | 0 refills | Status: AC
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
## 2021-10-19 ENCOUNTER — Ambulatory Visit (INDEPENDENT_AMBULATORY_CARE_PROVIDER_SITE_OTHER): Payer: BC Managed Care – PPO | Admitting: Family Medicine

## 2021-10-19 ENCOUNTER — Other Ambulatory Visit: Payer: Self-pay

## 2021-10-19 ENCOUNTER — Encounter: Payer: Self-pay | Admitting: Family Medicine

## 2021-10-19 VITALS — BP 112/78 | HR 109 | Temp 99.4°F | Ht 73.0 in | Wt 277.0 lb

## 2021-10-19 DIAGNOSIS — J101 Influenza due to other identified influenza virus with other respiratory manifestations: Secondary | ICD-10-CM | POA: Diagnosis not present

## 2021-10-19 DIAGNOSIS — R051 Acute cough: Secondary | ICD-10-CM

## 2021-10-19 DIAGNOSIS — R509 Fever, unspecified: Secondary | ICD-10-CM

## 2021-10-19 LAB — POCT INFLUENZA A/B
Influenza A, POC: POSITIVE — AB
Influenza B, POC: NEGATIVE

## 2021-10-19 LAB — POC COVID19 BINAXNOW: SARS Coronavirus 2 Ag: NEGATIVE

## 2021-10-19 MED ORDER — OSELTAMIVIR PHOSPHATE 75 MG PO CAPS: 75.0000 mg | ORAL_CAPSULE | Freq: Two times a day (BID) | ORAL | 0 refills | Status: DC

## 2021-10-19 MED ORDER — GUAIFENESIN-CODEINE 100-10 MG/5ML PO SYRP: 5.0000 mL | ORAL_SOLUTION | Freq: Three times a day (TID) | ORAL | 0 refills | Status: DC | PRN
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
## 2022-01-31 ENCOUNTER — Ambulatory Visit (INDEPENDENT_AMBULATORY_CARE_PROVIDER_SITE_OTHER): Payer: BC Managed Care – PPO | Admitting: Family Medicine

## 2022-01-31 ENCOUNTER — Ambulatory Visit
Admission: RE | Admit: 2022-01-31 | Discharge: 2022-01-31 | Disposition: A | Discharge status: Home / Self Care | Payer: BC Managed Care – PPO | Attending: Family Medicine | Admitting: Family Medicine

## 2022-01-31 ENCOUNTER — Other Ambulatory Visit: Payer: Self-pay

## 2022-01-31 ENCOUNTER — Encounter: Payer: Self-pay | Admitting: Family Medicine

## 2022-01-31 ENCOUNTER — Ambulatory Visit
Admission: RE | Admit: 2022-01-31 | Discharge: 2022-01-31 | Disposition: A | Discharge status: Home / Self Care | Payer: BC Managed Care – PPO | Source: Ambulatory Visit | Attending: Family Medicine | Admitting: Family Medicine

## 2022-01-31 VITALS — BP 110/60 | HR 80 | Ht 73.0 in | Wt 279.0 lb

## 2022-01-31 DIAGNOSIS — I10 Essential (primary) hypertension: Secondary | ICD-10-CM | POA: Diagnosis not present

## 2022-01-31 DIAGNOSIS — M6789 Other specified disorders of synovium and tendon, multiple sites: Secondary | ICD-10-CM | POA: Insufficient documentation

## 2022-01-31 DIAGNOSIS — J301 Allergic rhinitis due to pollen: Secondary | ICD-10-CM

## 2022-01-31 DIAGNOSIS — S6991XA Unspecified injury of right wrist, hand and finger(s), initial encounter: Secondary | ICD-10-CM | POA: Diagnosis not present

## 2022-01-31 MED ORDER — LISINOPRIL 5 MG PO TABS: 5.0000 mg | ORAL_TABLET | Freq: Every day | ORAL | 1 refills | Status: AC

## 2022-01-31 MED ORDER — TRIAMCINOLONE ACETONIDE 55 MCG/ACT NA AERO: 2.0000 | INHALATION_SPRAY | Freq: Every day | NASAL | 12 refills | Status: DC

## 2022-01-31 MED ORDER — MUPIROCIN 2 % EX OINT: 1.0000 "application " | TOPICAL_OINTMENT | Freq: Two times a day (BID) | CUTANEOUS | 0 refills | Status: DC

## 2022-01-31 MED ORDER — CEPHALEXIN 500 MG PO CAPS: 500.0000 mg | ORAL_CAPSULE | Freq: Three times a day (TID) | ORAL | 0 refills | Status: DC
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
## 2022-02-01 NOTE — Progress Notes (Signed)
Thanks, glad that there is no fracture - he is to continue the splint until follow-up for tendon injury noted at initial visit with Dr. Ronnald Ramp.

## 2022-02-03 ENCOUNTER — Other Ambulatory Visit: Payer: Self-pay

## 2022-02-03 ENCOUNTER — Encounter: Payer: Self-pay | Admitting: Family Medicine

## 2022-02-03 ENCOUNTER — Ambulatory Visit (INDEPENDENT_AMBULATORY_CARE_PROVIDER_SITE_OTHER): Payer: BC Managed Care – PPO | Admitting: Family Medicine

## 2022-02-03 VITALS — BP 120/70 | HR 84 | Ht 73.0 in | Wt 280.0 lb

## 2022-02-03 DIAGNOSIS — S61214A Laceration without foreign body of right ring finger without damage to nail, initial encounter: Secondary | ICD-10-CM | POA: Diagnosis not present

## 2022-02-03 DIAGNOSIS — S62629A Displaced fracture of medial phalanx of unspecified finger, initial encounter for closed fracture: Secondary | ICD-10-CM

## 2022-02-03 MED ORDER — VITAMIN D (ERGOCALCIFEROL) 1.25 MG (50000 UNIT) PO CAPS: 50000.0000 [IU] | ORAL_CAPSULE | ORAL | 0 refills | Status: DC
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
## 2022-02-03 NOTE — Patient Instructions (Addendum)
-   Wear brace at all times as it for a cast, okay to remove for driving, eating, dressing, hand hygiene, and 1-2 times daily for hand exercises ?- Okay to return to work next week Monday 3/6 with work note and associated restrictions ?- Start Rx weekly vitamin D and daily OTC calcium 2000 mg ?- Return for follow-up in 4 weeks, obtain new x-rays prior to visit for review ?- Finish out remaining antibiotics as previously prescribed by Dr. Ronnald Ramp ?- Can discontinue topical antibiotic ?-Use ice at 20-minute intervals for pain control ?- For persistent pain, use Norco meloxicam on as-needed basis probable ?- Contact our office for any questions or issues between now and then ?

## 2022-02-03 NOTE — Assessment & Plan Note (Signed)
Laceration to the right fourth dorsal proximal phalanx, he is up-to-date on tetanus, has been attempting to use Band-Aid, oral and topical antibiotics.  He is having difficulty with wound closure and notes continued bleeding despite diligent wound care.  After cleaning of the wound, tissue adhesive wound closing performed for this 2 cm laceration. ?

## 2022-02-03 NOTE — Progress Notes (Signed)
?  ? ?Primary Care / Sports Medicine Office Visit ? ?Patient Information:  ?Patient ID: REFORD OLLIFF, male DOB: [birth year] Age: 26 y.o. MRN: [id number]  ? ?Ryan Pennington is a pleasant 26 y.o. male presenting with the following: ? ?Chief Complaint  ?Patient presents with  ? right 4th digit pain  ?  RHD, onset 1 week prior, punched at machine, wound on finger, saw Dr. Ronnald Ramp initially.  ? ? ?Vitals:  ? 02/03/22 1123  ?BP: 120/70  ?Pulse: 84  ? ?Vitals:  ? 02/03/22 1123  ?Weight: 280 lb (127 kg)  ?Height: 6\' 1"  (1.854 m)  ? ?Body mass index is 36.94 kg/m?. ? ?DG Hand Complete Right ? ?Result Date: 01/31/2022 ?CLINICAL DATA:  Trauma to fourth and fifth digits, pain EXAM: RIGHT HAND - COMPLETE 3+ VIEW COMPARISON:  12/12/2017 FINDINGS: Frontal, oblique, and lateral views of the right hand are obtained. No acute fracture, subluxation, or dislocation. Joint spaces are well preserved. Soft tissues are unremarkable. IMPRESSION: 1. No acute displaced fracture. Electronically Signed   By: Randa Ngo M.D.   On: 01/31/2022 15:44    ? ?Independent interpretation of notes and tests performed by another provider:  ? ?Independent interpretation of right hand x-ray dated 01/23/2022 reveals, lateral view, subtle avulsion/chip fracture to the proximal aspect of the middle fourth phalanx, additionally on AP/oblique views, there is questionable cortical irregularity at the radial aspect of the fourth metacarpal. ? ?Procedures performed:  ? ?Laceration repair ?Indication: bleeding, wound closure issues ?Location: Dorsal fourth proximal phalanx right hand ?Size: 2 cm ?Anesthesia: None ?Type of wound closure: Dermabond ?Wound margins approximated, Dermabond applied and position maintained until adequately set ?Tolerated well ?Routine postprocedure instructions d/w pt- keep area clean and follow up if concerns/spreading erythema/pain.   ?Follow up as per routine ? ?Pertinent History, Exam, Impression, and Recommendations:  ? ?Closed  avulsion fracture of middle phalanx of finger ?Right-hand-dominant patient presenting for initial formal evaluation with me of patient's right fourth digit pain, swelling in the setting of trauma.  He was using an arcade punch machine, struck the metal back of the machine and had acute onset of pain.  Sustained laceration to his fourth digit as well, did see his PCP Dr. Ronnald Ramp on 01/31/2022, placed in AlumaFoam splint, ordered x-rays, and started on antibiotics. ? ?Patient's right hand demonstrates swelling without ecchymosis about the fourth digit, linear roughly 2 cm longitudinally oriented laceration on the dorsal proximal phalanx, margins separated, he has full, though painful flexion and extension at the fourth MCP, PIP, and DIP, resisted flexion maximally painful localizing to the PIP, maximal tenderness with palpation about the volar aspect of the middle phalanx fourth digit, deep palpation throughout the hand otherwise benign inclusive of base of the fourth metacarpal, sensorimotor intact. ? ?Given patient's mechanism of injury, clinical and radiographic findings, concern for avulsion/chip fracture to the fourth middle phalanx, proximally, essentially nondisplaced.  The other questionable radiographic finding of the base of the fourth metacarpal may represent old injury or essentially asymptomatic acute injury.  In addition to Dermabond for the laceration, I have placed the patient into a TKO brace, advised him on appropriate measures, home-based exercises, and we will obtain new x-rays and follow-up in 4 weeks.  We placed on Rx course of vitamin D, daily calcium as well as continue antibiotics from Dr. Ronnald Ramp.  Have advised him to transition to as needed dosing of meloxicam. ? ?Laceration of right ring finger without foreign body without damage to nail ?Laceration to  the right fourth dorsal proximal phalanx, he is up-to-date on tetanus, has been attempting to use Band-Aid, oral and topical antibiotics.  He is  having difficulty with wound closure and notes continued bleeding despite diligent wound care.  After cleaning of the wound, tissue adhesive wound closing performed for this 2 cm laceration.  ? ?Orders & Medications ?Meds ordered this encounter  ?Medications  ? Vitamin D, Ergocalciferol, (DRISDOL) 1.25 MG (50000 UNIT) CAPS capsule  ?  Sig: Take 1 capsule (50,000 Units total) by mouth every 7 (seven) days. Take for 8 total doses(weeks)  ?  Dispense:  8 capsule  ?  Refill:  0  ? ?Orders Placed This Encounter  ?Procedures  ? DG Finger Ring Right  ?  ? ?No follow-ups on file.  ?  ? ?Montel Culver, MD ? ? Primary Care Sports Medicine ?[facility] ?[place]  ? ?

## 2022-03-03 ENCOUNTER — Ambulatory Visit (INDEPENDENT_AMBULATORY_CARE_PROVIDER_SITE_OTHER): Payer: BC Managed Care – PPO | Admitting: Family Medicine

## 2022-03-03 ENCOUNTER — Ambulatory Visit
Admission: RE | Admit: 2022-03-03 | Discharge: 2022-03-03 | Disposition: A | Discharge status: Home / Self Care | Payer: BC Managed Care – PPO | Attending: Family Medicine | Admitting: Family Medicine

## 2022-03-03 ENCOUNTER — Encounter: Payer: Self-pay | Admitting: Family Medicine

## 2022-03-03 ENCOUNTER — Ambulatory Visit
Admission: RE | Admit: 2022-03-03 | Discharge: 2022-03-03 | Disposition: A | Discharge status: Home / Self Care | Payer: BC Managed Care – PPO | Source: Ambulatory Visit | Attending: Family Medicine | Admitting: Family Medicine

## 2022-03-03 VITALS — BP 118/84 | HR 78 | Ht 73.0 in | Wt 287.0 lb

## 2022-03-03 DIAGNOSIS — S62654A Nondisplaced fracture of medial phalanx of right ring finger, initial encounter for closed fracture: Secondary | ICD-10-CM | POA: Diagnosis not present

## 2022-03-03 DIAGNOSIS — S61214D Laceration without foreign body of right ring finger without damage to nail, subsequent encounter: Secondary | ICD-10-CM | POA: Diagnosis not present

## 2022-03-03 DIAGNOSIS — S62629A Displaced fracture of medial phalanx of unspecified finger, initial encounter for closed fracture: Secondary | ICD-10-CM | POA: Diagnosis not present

## 2022-03-03 NOTE — Assessment & Plan Note (Signed)
Ryan Pennington presents for follow-up to right fourth digit closed avulsion fracture of the proximal aspect of his middle phalanx.  Date of injury 01/28/2022.  At his last visit with [place] on 02/03/2022, patient was placed into a TKO brace, advised out of work restrictions, Rx vitamin D, daily calcium, and close follow-up in 4 weeks. ? ?Since that time he has been compliant with the plan, has been noting essential resolution of pain, does note persistent swelling, has been working on home exercises as prescribed and is able to make a fist, no additional or new findings.  Previous laceration has essentially healed. ? ?Examination demonstrates near full range of motion, full painless resisted flexion and extension at the fourth digit, sensation intact, nontender with deep palpation or percussion at the volar aspect of the fourth PIP.  He does have generalized trace-1+ soft tissue swelling without effusion, previously noted dorsal laceration is healing. ? ?Given his reassuring x-rays, physical exam findings, I have advised him to discontinue TKO brace, transition to buddy tape, return to work without restrictions, finish out remaining Rx vitamin D, continue home-based rehab but with a dedicated focus on attaining full painless range of motion, gentle massage to address the soft tissue swelling which may persist for several months, and he will return for at least 1 more visit in 4 weeks with a new x-ray prior to that visit for review. ?

## 2022-03-03 NOTE — Patient Instructions (Signed)
-   Use buddy tape as discussed throughout the day, okay to remove at work if symptoms allow ?- Can return to work without restrictions starting March 06, 2022 ?- Ryan Pennington out remaining Rx vitamin D and continue daily calcium 2000 mg until Rx vitamin D complete ?- Try to utilize hand as naturally as possible using finger pain symptoms as a guide ?- Perform massage throughout the finger to aid in swelling ?- Continue home exercises as previously provided ?- Return for follow-up in 4 weeks, obtain new x-rays prior to that visit for review ?

## 2022-03-03 NOTE — Assessment & Plan Note (Signed)
See additional assessment(s) for plan details. 

## 2022-03-03 NOTE — Progress Notes (Signed)
?  ? ?  Primary Care / Sports Medicine Office Visit ? ?Patient Information:  ?Patient ID: Ryan Pennington, male DOB: 1996-05-11 Age: 26 y.o. MRN: [id number]  ? ?Ryan Pennington is a pleasant 26 y.o. male presenting with the following: ? ?Chief Complaint  ?Patient presents with  ? Follow-up  ?  Avulsion Fracture middle finger. Can ball fist with little tightness, no pain. Wearing brace.   ? ? ?Vitals:  ? 03/03/22 1041  ?BP: 118/84  ?Pulse: 78  ?SpO2: 98%  ? ?Vitals:  ? 03/03/22 1039  ?Weight: 287 lb (130.2 kg)  ?Height: '6\' 1"'$  (1.854 m)  ? ?Body mass index is 37.87 kg/m?. ? ?No results found.  ? ?Independent interpretation of notes and tests performed by another provider:  ? ?Independent interpretation of right fourth digit x-rays reviewed revealing expected osseous resorption about the nondisplaced volar, oblique avulsion fracture at the proximal aspect of the middle phalanx, no interval displacement, stable soft tissue shadow consistent with swelling noted ? ?Procedures performed:  ? ?Buddy taping of right hand digits 3 and 4 performed ? ?Pertinent History, Exam, Impression, and Recommendations:  ? ?Closed avulsion fracture of middle phalanx of finger ?Ryan Pennington presents for follow-up to right fourth digit closed avulsion fracture of the proximal aspect of his middle phalanx.  Date of injury 01/28/2022.  At his last visit with Ryan Pennington on 02/03/2022, patient was placed into a TKO brace, advised out of work restrictions, Rx vitamin D, daily calcium, and close follow-up in 4 weeks. ? ?Since that time he has been compliant with the plan, has been noting essential resolution of pain, does note persistent swelling, has been working on home exercises as prescribed and is able to make a fist, no additional or new findings.  Previous laceration has essentially healed. ? ?Examination demonstrates near full range of motion, full painless resisted flexion and extension at the fourth digit, sensation intact, nontender with deep palpation or  percussion at the volar aspect of the fourth PIP.  He does have generalized trace-1+ soft tissue swelling without effusion, previously noted dorsal laceration is healing. ? ?Given his reassuring x-rays, physical exam findings, I have advised him to discontinue TKO brace, transition to buddy tape, return to work without restrictions, finish out remaining Rx vitamin D, continue home-based rehab but with a dedicated focus on attaining full painless range of motion, gentle massage to address the soft tissue swelling which may persist for several months, and he will return for at least 1 more visit in 4 weeks with a new x-ray prior to that visit for review. ? ?Laceration of right ring finger without foreign body without damage to nail ?See additional assessment(s) for plan details.  ? ?Orders & Medications ?No orders of the defined types were placed in this encounter. ? ?Orders Placed This Encounter  ?Procedures  ? DG Finger Ring Right  ?  ? ?Return in about 4 weeks (around 03/31/2022).  ?  ? ?Ryan Culver, MD ? ? Primary Care Sports Medicine ?[facility] ?[place]  ? ?

## 2022-03-31 ENCOUNTER — Ambulatory Visit
Admission: RE | Admit: 2022-03-31 | Discharge: 2022-03-31 | Disposition: A | Discharge status: Home / Self Care | Payer: BC Managed Care – PPO | Attending: Family Medicine | Admitting: Family Medicine

## 2022-03-31 ENCOUNTER — Ambulatory Visit
Admission: RE | Admit: 2022-03-31 | Discharge: 2022-03-31 | Disposition: A | Discharge status: Home / Self Care | Payer: BC Managed Care – PPO | Source: Ambulatory Visit | Attending: Family Medicine | Admitting: Family Medicine

## 2022-03-31 ENCOUNTER — Ambulatory Visit (INDEPENDENT_AMBULATORY_CARE_PROVIDER_SITE_OTHER): Payer: BC Managed Care – PPO | Admitting: Family Medicine

## 2022-03-31 ENCOUNTER — Encounter: Payer: Self-pay | Admitting: Family Medicine

## 2022-03-31 DIAGNOSIS — S62629A Displaced fracture of medial phalanx of unspecified finger, initial encounter for closed fracture: Secondary | ICD-10-CM | POA: Insufficient documentation

## 2022-03-31 DIAGNOSIS — S62629D Displaced fracture of medial phalanx of unspecified finger, subsequent encounter for fracture with routine healing: Secondary | ICD-10-CM | POA: Diagnosis not present

## 2022-03-31 DIAGNOSIS — G35 Multiple sclerosis: Secondary | ICD-10-CM | POA: Diagnosis not present

## 2022-03-31 DIAGNOSIS — S62654D Nondisplaced fracture of medial phalanx of right ring finger, subsequent encounter for fracture with routine healing: Secondary | ICD-10-CM | POA: Diagnosis not present

## 2022-04-03 NOTE — Assessment & Plan Note (Signed)
Patient continues to make excellent progress in regards to function, feels no discomfort, has been at work at full capacity without restrictions and tolerating without issue. ? ?Examination reveals full range of motion that is painless, sensorimotor intact throughout the digit, nontender with deep palpation and percussion about the proximal aspect of the middle phalanx. ? ?Given his reassuring x-rays and clinical findings today, have advised him to work towards full activity without restriction, follow-up as needed. ?

## 2022-04-03 NOTE — Progress Notes (Signed)
?  ? ?  Primary Care / Sports Medicine Office Visit ? ?Patient Information:  ?Patient ID: Ryan Pennington, male DOB: 25-Feb-1996 Age: 26 y.o. MRN: [id number]  ? ?Ryan Pennington is a pleasant 26 y.o. male presenting with the following: ? ?Chief Complaint  ?Patient presents with  ? Follow-up  ?  Pt here to F/U on avulsion fracture of middle phalanx of finger  ? ? ?Vitals:  ? 03/31/22 1004  ?BP: 122/78  ?Pulse: 88  ?SpO2: 98%  ? ?Vitals:  ? 03/31/22 1004  ?Weight: 288 lb (130.6 kg)  ?Height: '6\' 2"'$  (1.88 m)  ? ?Body mass index is 36.98 kg/m?. ? ?DG Finger Ring Right ? ?Result Date: 03/31/2022 ?CLINICAL DATA:  Follow-up middle phalanx proximal aspect fracture EXAM: RIGHT RING FINGER 2+V COMPARISON:  Right ring finger 03/03/2022 FINDINGS: Redemonstration of linear lucency within the volar base of the middle phalanx of the fourth finger with intra-articular extension. New mild healing sclerosis. This is best seen on lateral view. Joint spaces are preserved. IMPRESSION: Mild early healing of nondisplaced intra-articular fracture of the volar base of the middle phalanx of the ring finger. Electronically Signed   By: Yvonne Kendall M.D.   On: 03/31/2022 18:06    ? ?Independent interpretation of notes and tests performed by another provider:  ? ?Independent interpretation of right ring finger x-ray reveals interval prominent callus formation at the proximal phalanx, sclerosis consistent with healing, no additional/acute findings noted ? ?Procedures performed:  ? ?None ? ?Pertinent History, Exam, Impression, and Recommendations:  ? ?Problem List Items Addressed This Visit   ? ?  ? Musculoskeletal and Integument  ? Closed avulsion fracture of middle phalanx of finger  ?  Patient continues to make excellent progress in regards to function, feels no discomfort, has been at work at full capacity without restrictions and tolerating without issue. ? ?Examination reveals full range of motion that is painless, sensorimotor intact  throughout the digit, nontender with deep palpation and percussion about the proximal aspect of the middle phalanx. ? ?Given his reassuring x-rays and clinical findings today, have advised him to work towards full activity without restriction, follow-up as needed. ? ?  ?  ?  ? ?Orders & Medications ?No orders of the defined types were placed in this encounter. ? ?No orders of the defined types were placed in this encounter. ?  ? ?No follow-ups on file.  ?  ? ?Montel Culver, MD ? ? Primary Care Sports Medicine ?[facility] ?[place]  ? ?

## 2022-07-14 ENCOUNTER — Ambulatory Visit (INDEPENDENT_AMBULATORY_CARE_PROVIDER_SITE_OTHER): Payer: BC Managed Care – PPO | Admitting: Family Medicine

## 2022-07-14 ENCOUNTER — Encounter: Payer: Self-pay | Admitting: Family Medicine

## 2022-07-14 VITALS — BP 135/88 | HR 91 | Ht 74.0 in | Wt 290.0 lb

## 2022-07-14 DIAGNOSIS — Z0000 Encounter for general adult medical examination without abnormal findings: Secondary | ICD-10-CM | POA: Diagnosis not present

## 2022-07-14 DIAGNOSIS — G4733 Obstructive sleep apnea (adult) (pediatric): Secondary | ICD-10-CM
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
## 2022-07-15 LAB — RENAL FUNCTION PANEL
Albumin: 4.8 g/dL (ref 4.3–5.2)
BUN/Creatinine Ratio: 16 (ref 9–20)
BUN: 17 mg/dL (ref 6–20)
CO2: 23 mmol/L (ref 20–29)
Calcium: 9.8 mg/dL (ref 8.7–10.2)
Chloride: 97 mmol/L (ref 96–106)
Creatinine, Ser: 1.05 mg/dL (ref 0.76–1.27)
Glucose: 94 mg/dL (ref 70–99)
Phosphorus: 3.4 mg/dL (ref 2.8–4.1)
Potassium: 4.4 mmol/L (ref 3.5–5.2)
Sodium: 141 mmol/L (ref 134–144)
eGFR: 100 mL/min/{1.73_m2} (ref >59)

## 2022-07-15 LAB — CBC WITH DIFFERENTIAL/PLATELET
Basophils Absolute: 0 10*3/uL (ref 0.0–0.2)
Basos: 1 %
EOS (ABSOLUTE): 0.1 10*3/uL (ref 0.0–0.4)
Eos: 2 %
Hematocrit: 48.1 % (ref 37.5–51.0)
Hemoglobin: 16.4 g/dL (ref 13.0–17.7)
Immature Grans (Abs): 0.1 10*3/uL (ref 0.0–0.1)
Immature Granulocytes: 1 %
Lymphocytes Absolute: 2 10*3/uL (ref 0.7–3.1)
Lymphs: 31 %
MCH: 29.2 pg (ref 26.6–33.0)
MCHC: 34.1 g/dL (ref 31.5–35.7)
MCV: 86 fL (ref 79–97)
Monocytes Absolute: 0.6 10*3/uL (ref 0.1–0.9)
Monocytes: 10 %
Neutrophils Absolute: 3.6 10*3/uL (ref 1.4–7.0)
Neutrophils: 55 %
Platelets: 217 10*3/uL (ref 150–450)
RBC: 5.62 x10E6/uL (ref 4.14–5.80)
RDW: 12.2 % (ref 11.6–15.4)
WBC: 6.5 10*3/uL (ref 3.4–10.8)

## 2022-07-15 LAB — LIPID PANEL WITH LDL/HDL RATIO
Cholesterol, Total: 135 mg/dL (ref 100–199)
HDL: 42 mg/dL (ref >39)
LDL Chol Calc (NIH): 79 mg/dL (ref 0–99)
LDL/HDL Ratio: 1.9 ratio (ref 0.0–3.6)
Triglycerides: 69 mg/dL (ref 0–149)
VLDL Cholesterol Cal: 14 mg/dL (ref 5–40)

## 2022-07-17 ENCOUNTER — Encounter: Payer: Self-pay | Admitting: Family Medicine

## 2022-07-24 ENCOUNTER — Other Ambulatory Visit: Payer: Self-pay

## 2022-07-24 DIAGNOSIS — D229 Melanocytic nevi, unspecified: Secondary | ICD-10-CM

## 2022-07-28 ENCOUNTER — Ambulatory Visit (INDEPENDENT_AMBULATORY_CARE_PROVIDER_SITE_OTHER): Payer: BC Managed Care – PPO | Admitting: Family Medicine

## 2022-07-28 ENCOUNTER — Encounter: Payer: Self-pay | Admitting: Family Medicine

## 2022-07-28 VITALS — BP 130/80 | HR 80 | Ht 74.0 in | Wt 292.0 lb

## 2022-07-28 DIAGNOSIS — M222X2 Patellofemoral disorders, left knee: Secondary | ICD-10-CM | POA: Diagnosis not present

## 2022-07-28 DIAGNOSIS — M222X1 Patellofemoral disorders, right knee: Secondary | ICD-10-CM | POA: Diagnosis not present

## 2022-07-28 MED ORDER — DICLOFENAC SODIUM 75 MG PO TBEC: 75.0000 mg | DELAYED_RELEASE_TABLET | Freq: Two times a day (BID) | ORAL | 0 refills | Status: DC | PRN

## 2022-07-28 NOTE — Assessment & Plan Note (Signed)
Patient presents with acute on chronic bilateral anterior knee pain, dating back to high school, denies any acute onset time, but gradually worsened with physical activity.  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  Pain to the anterior knees without radiation, involving clicking and popping, aggravated with squatting, bending, prolonged weightbearing.  Did trial meloxicam with modest response.  Examination reveals no abnormalities to inspection, no effusion, range of motion is full with mild pain during maximal flexion bilaterally, dynamic patella maltracking noted bilaterally, no excess ligamentous laxity noted and provocative testing including anterior/posterior drawer, varus/valgus stressing, and McMurray testing are negative bilaterally.  Single-leg squat reveals next steps and valgus and heightened pain bilaterally.  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  Status update after 6 weeks requested, if suboptimal response noted, can consider corticosteroid injections versus advanced imaging versus physical therapy, imaging.

## 2022-07-28 NOTE — Progress Notes (Signed)
     Primary Care / Sports Medicine Office Visit  Patient Information:  Patient ID: Ryan Pennington, male DOB: [birth year] Age: 26 y.o. MRN: [id number]   Ryan Pennington is a pleasant 26 y.o. male presenting with the following:  Chief Complaint  Patient presents with   Knee Pain    Bilateral, torn ligaments in both knees in high school, pt states that it feels like the ligaments again, bothering him on and off for 6 months    Vitals:   07/28/22 0925  BP: 130/80  Pulse: 80   Vitals:   07/28/22 0925  Weight: 292 lb (132.5 kg)  Height: '6\' 2"'$  (1.88 m)   Body mass index is 37.49 kg/m.  No results found.   Independent interpretation of notes and tests performed by another provider:   None  Procedures performed:   None  Pertinent History, Exam, Impression, and Recommendations:   Problem List Items Addressed This Visit       Musculoskeletal and Integument   Patellofemoral syndrome, bilateral - Primary    Patient presents with acute on chronic bilateral anterior knee pain, dating back to high school, denies any acute onset time, but gradually worsened with physical activity.  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  Pain to the anterior knees without radiation, involving clicking and popping, aggravated with squatting, bending, prolonged weightbearing.  Did trial meloxicam with modest response.  Examination reveals no abnormalities to inspection, no effusion, range of motion is full with mild pain during maximal flexion bilaterally, dynamic patella maltracking noted bilaterally, no excess ligamentous laxity noted and provocative testing including anterior/posterior drawer, varus/valgus stressing, and McMurray testing are negative bilaterally.  Single-leg squat reveals next steps and valgus and heightened pain bilaterally.  Clinical history and findings are most consistent with patellofemoral syndrome bilaterally,  treatment strategies were outlined and he will utilize patellar stabilizing braces during work hours and while symptomatic, start home-based rehab, and transition from meloxicam to diclofenac twice daily as needed.  Status update after 6 weeks requested, if suboptimal response noted, can consider corticosteroid injections versus advanced imaging versus physical therapy, imaging.      Relevant Medications   diclofenac (VOLTAREN) 75 MG EC tablet     Orders & Medications Meds ordered this encounter  Medications   diclofenac (VOLTAREN) 75 MG EC tablet    Sig: Take 1 tablet (75 mg total) by mouth 2 (two) times daily as needed.    Dispense:  90 tablet    Refill:  0   No orders of the defined types were placed in this encounter.    No follow-ups on file.     Montel Culver, MD   Primary Care [facility] [place]

## 2022-07-28 NOTE — Patient Instructions (Signed)
-   Use knee braces during work hours and as needed for knee pain - Start home exercise with information provided and continue x6 weeks - Dose diclofenac up to twice a day on as-needed basis for knee pain (take with food) - Stop meloxicam - Contact our office in 6 weeks to provide a status update or for any questions between now and then

## 2022-08-31 DIAGNOSIS — R0683 Snoring: Secondary | ICD-10-CM | POA: Diagnosis not present

## 2022-08-31 DIAGNOSIS — J342 Deviated nasal septum: Secondary | ICD-10-CM | POA: Diagnosis not present

## 2022-08-31 DIAGNOSIS — J301 Allergic rhinitis due to pollen: Secondary | ICD-10-CM | POA: Diagnosis not present

## 2022-11-13 ENCOUNTER — Encounter: Payer: Self-pay | Admitting: Family Medicine

## 2022-11-13 ENCOUNTER — Ambulatory Visit (INDEPENDENT_AMBULATORY_CARE_PROVIDER_SITE_OTHER): Payer: BC Managed Care – PPO | Admitting: Family Medicine

## 2022-11-13 VITALS — BP 120/78 | HR 110 | Temp 99.2°F | Ht 74.0 in | Wt 294.0 lb

## 2022-11-13 DIAGNOSIS — J0100 Acute maxillary sinusitis, unspecified: Secondary | ICD-10-CM

## 2022-11-13 DIAGNOSIS — R051 Acute cough: Secondary | ICD-10-CM | POA: Diagnosis not present

## 2022-11-13 DIAGNOSIS — R509 Fever, unspecified: Secondary | ICD-10-CM | POA: Diagnosis not present

## 2022-11-13 LAB — POCT INFLUENZA A/B
Influenza A, POC: NEGATIVE
Influenza B, POC: NEGATIVE

## 2022-11-13 MED ORDER — AZITHROMYCIN 250 MG PO TABS: ORAL_TABLET | ORAL | 0 refills | Status: AC

## 2022-11-13 NOTE — Patient Instructions (Signed)
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
## 2023-02-28 ENCOUNTER — Encounter: Payer: Self-pay | Admitting: Dermatology

## 2023-02-28 ENCOUNTER — Ambulatory Visit (INDEPENDENT_AMBULATORY_CARE_PROVIDER_SITE_OTHER): Payer: BC Managed Care – PPO | Admitting: Dermatology

## 2023-02-28 ENCOUNTER — Ambulatory Visit: Payer: BC Managed Care – PPO | Admitting: Dermatology

## 2023-02-28 VITALS — BP 149/87 | HR 71

## 2023-02-28 DIAGNOSIS — D2361 Other benign neoplasm of skin of right upper limb, including shoulder: Secondary | ICD-10-CM

## 2023-02-28 DIAGNOSIS — D489 Neoplasm of uncertain behavior, unspecified: Secondary | ICD-10-CM

## 2023-02-28 NOTE — Progress Notes (Signed)
  New pt Visit   Subjective  Ryan Pennington is a 27 y.o. male who presents for the following: new patient concerned about mole at right shoulder noticed about a year Concering. Denies no family of skin cancer              The following portions of the chart were reviewed this encounter and updated as appropriate: medications, allergies, medical history  Review of Systems:  No other skin or systemic complaints except as noted in HPI or Assessment and Plan.  Objective  Well appearing patient in no apparent distress; mood and affect are within normal limits.  A focused examination was performed of the following areas: Top of right shoulder  Relevant exam findings are noted in the Assessment and Plan.  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 Epidermal / dermal shaving  Lesion diameter (cm):  1.1 Informed consent: discussed and consent obtained   Timeout: patient name, date of birth, surgical site, and procedure verified   Procedure prep:  Patient was prepped and draped in usual sterile fashion Prep type:  Isopropyl alcohol Anesthesia: the lesion was anesthetized in a standard fashion   Anesthetic:  1% lidocaine w/ epinephrine 1-100,000 buffered w/ 8.4% NaHCO3 Instrument used: flexible razor blade   Hemostasis achieved with: pressure, aluminum chloride and electrodesiccation   Outcome: patient tolerated procedure well   Post-procedure details: sterile dressing applied and wound care instructions given   Dressing type: bandage and petrolatum    Specimen 1 - Surgical pathology Differential Diagnosis: R/o dermatofibroma vs other  Check Margins: No R/o dermatofibroma vs other  Return if symptoms worsen or fail to improve.  IRuthell Rummage, CMA, am acting as scribe for Sarina Ser, MD.  Documentation: I have reviewed the above documentation for accuracy and completeness, and I agree  with the above.  Sarina Ser, MD

## 2023-02-28 NOTE — Patient Instructions (Signed)
   Biopsy Wound Care Instructions  Leave the original bandage on for 24 hours if possible.  If the bandage becomes soaked or soiled before that time, it is OK to remove it and examine the wound.  A small amount of post-operative bleeding is normal.  If excessive bleeding occurs, remove the bandage, place gauze over the site and apply continuous pressure (no peeking) over the area for 30 minutes. If this does not work, please call our clinic as soon as possible or page your doctor if it is after hours.   Once a day, cleanse the wound with soap and water. It is fine to shower. If a thick crust develops you may use a Q-tip dipped into dilute hydrogen peroxide (mix 1:1 with water) to dissolve it.  Hydrogen peroxide can slow the healing process, so use it only as needed.    After washing, apply petroleum jelly (Vaseline) or an antibiotic ointment if your doctor prescribed one for you, followed by a bandage.    For best healing, the wound should be covered with a layer of ointment at all times. If you are not able to keep the area covered with a bandage to hold the ointment in place, this may mean re-applying the ointment several times a day.  Continue this wound care until the wound has healed and is no longer open.   Itching and mild discomfort is normal during the healing process. However, if you develop pain or severe itching, please call our office.   If you have any discomfort, you can take Tylenol (acetaminophen) or ibuprofen as directed on the bottle. (Please do not take these if you have an allergy to them or cannot take them for another reason).  Some redness, tenderness and white or yellow material in the wound is normal healing.  If the area becomes very sore and red, or develops a thick yellow-green material (pus), it may be infected; please notify us.    If you have stitches, return to clinic as directed to have the stitches removed. You will continue wound care for 2-3 days after the  stitches are removed.   Wound healing continues for up to one year following surgery. It is not unusual to experience pain in the scar from time to time during the interval.  If the pain becomes severe or the scar thickens, you should notify the office.    A slight amount of redness in a scar is expected for the first six months.  After six months, the redness will fade and the scar will soften and fade.  The color difference becomes less noticeable with time.  If there are any problems, return for a post-op surgery check at your earliest convenience.  To improve the appearance of the scar, you can use silicone scar gel, cream, or sheets (such as Mederma or Serica) every night for up to one year. These are available over the counter (without a prescription).  Please call our office at [phone] for any questions or concerns.     Due to recent changes in healthcare laws, you may see results of your pathology and/or laboratory studies on MyChart before the doctors have had a chance to review them. We understand that in some cases there may be results that are confusing or concerning to you. Please understand that not all results are received at the same time and often the doctors may need to interpret multiple results in order to provide you with the best plan of   care or course of treatment. Therefore, we ask that you please give us 2 business days to thoroughly review all your results before contacting the office for clarification. Should we see a critical lab result, you will be contacted sooner.   If You Need Anything After Your Visit  If you have any questions or concerns for your doctor, please call our main line at [phone] and press option 4 to reach your doctor's medical assistant. If no one answers, please leave a voicemail as directed and we will return your call as soon as possible. Messages left after 4 pm will be answered the following business day.   You may also send us a  message via MyChart. We typically respond to MyChart messages within 1-2 business days.  For prescription refills, please ask your pharmacy to contact our office. Our fax number is [phone].  If you have an urgent issue when the clinic is closed that cannot wait until the next business day, you can page your doctor at the number below.    Please note that while we do our best to be available for urgent issues outside of office hours, we are not available 24/7.   If you have an urgent issue and are unable to reach us, you may choose to seek medical care at your doctor's office, retail clinic, urgent care center, or emergency room.  If you have a medical emergency, please immediately call 911 or go to the emergency department.  Pager Numbers  - Dr. Kowalski: [phone]  - Dr. Moye: [phone]  - Dr. Stewart: [phone]  In the event of inclement weather, please call our main line at [phone] for an update on the status of any delays or closures.  Dermatology Medication Tips: Please keep the boxes that topical medications come in in order to help keep track of the instructions about where and how to use these. Pharmacies typically print the medication instructions only on the boxes and not directly on the medication tubes.   If your medication is too expensive, please contact our office at [phone] option 4 or send us a message through MyChart.   We are unable to tell what your co-pay for medications will be in advance as this is different depending on your insurance coverage. However, we may be able to find a substitute medication at lower cost or fill out paperwork to get insurance to cover a needed medication.   If a prior authorization is required to get your medication covered by your insurance company, please allow us 1-2 business days to complete this process.  Drug prices often vary depending on where the prescription is filled and some pharmacies may offer  cheaper prices.  The website www.goodrx.com contains coupons for medications through different pharmacies. The prices here do not account for what the cost may be with help from insurance (it may be cheaper with your insurance), but the website can give you the price if you did not use any insurance.  - You can print the associated coupon and take it with your prescription to the pharmacy.  - You may also stop by our office during regular business hours and pick up a GoodRx coupon card.  - If you need your prescription sent electronically to a different pharmacy, notify our office through [facility] MyChart or by phone at [phone] option 4.     Si Usted Necesita Algo Despus de Su Visita  Tambin puede enviarnos un mensaje a travs de MyChart. Por lo general   respondemos a los mensajes de MyChart en el transcurso de 1 a 2 das hbiles.  Para renovar recetas, por favor pida a su farmacia que se ponga en contacto con nuestra oficina. Nuestro nmero de fax es el [phone].  Si tiene un asunto urgente cuando la clnica est cerrada y que no puede esperar hasta el siguiente da hbil, puede llamar/localizar a su doctor(a) al nmero que aparece a continuacin.   Por favor, tenga en cuenta que aunque hacemos todo lo posible para estar disponibles para asuntos urgentes fuera del horario de oficina, no estamos disponibles las 24 horas del da, los 7 das de la semana.   Si tiene un problema urgente y no puede comunicarse con nosotros, puede optar por buscar atencin mdica  en el consultorio de su doctor(a), en una clnica privada, en un centro de atencin urgente o en una sala de emergencias.  Si tiene una emergencia mdica, por favor llame inmediatamente al 911 o vaya a la sala de emergencias.  Nmeros de bper  - Dr. Kowalski: [phone]  - Dra. Moye: [phone]  - Dra. Stewart: [phone]  En caso de inclemencias del tiempo, por favor llame a nuestra lnea principal al  [phone] para una actualizacin sobre el estado de cualquier retraso o cierre.  Consejos para la medicacin en dermatologa: Por favor, guarde las cajas en las que vienen los medicamentos de uso tpico para ayudarle a seguir las instrucciones sobre dnde y cmo usarlos. Las farmacias generalmente imprimen las instrucciones del medicamento slo en las cajas y no directamente en los tubos del medicamento.   Si su medicamento es muy caro, por favor, pngase en contacto con nuestra oficina llamando al [phone] y presione la opcin 4 o envenos un mensaje a travs de MyChart.   No podemos decirle cul ser su copago por los medicamentos por adelantado ya que esto es diferente dependiendo de la cobertura de su seguro. Sin embargo, es posible que podamos encontrar un medicamento sustituto a menor costo o llenar un formulario para que el seguro cubra el medicamento que se considera necesario.   Si se requiere una autorizacin previa para que su compaa de seguros cubra su medicamento, por favor permtanos de 1 a 2 das hbiles para completar este proceso.  Los precios de los medicamentos varan con frecuencia dependiendo del lugar de dnde se surte la receta y alguna farmacias pueden ofrecer precios ms baratos.  El sitio web www.goodrx.com tiene cupones para medicamentos de diferentes farmacias. Los precios aqu no tienen en cuenta lo que podra costar con la ayuda del seguro (puede ser ms barato con su seguro), pero el sitio web puede darle el precio si no utiliz ningn seguro.  - Puede imprimir el cupn correspondiente y llevarlo con su receta a la farmacia.  - Tambin puede pasar por nuestra oficina durante el horario de atencin regular y recoger una tarjeta de cupones de GoodRx.  - Si necesita que su receta se enve electrnicamente a una farmacia diferente, informe a nuestra oficina a travs de MyChart de [facility] o por telfono llamando al [phone] y presione la opcin 4.  

## 2023-03-08 ENCOUNTER — Telehealth: Payer: Self-pay

## 2023-03-08 NOTE — Telephone Encounter (Signed)
Patient advised pathology showed benign dermatofibroma,  no treatment needed unless becomes symptomatic. Lurlean Horns., RMA

## 2023-03-08 NOTE — Telephone Encounter (Signed)
-----   Message from Ralene Bathe, MD sent at 03/07/2023  2:24 PM EDT ----- Diagnosis Skin , right top of shoulder DERMATOFIBROMA  Benign dermatofibroma As suspected May recur No further treatment needed unless recurs and /or symptomatic

## 2023-05-28 ENCOUNTER — Encounter: Payer: Self-pay | Admitting: Family Medicine

## 2023-05-28 ENCOUNTER — Ambulatory Visit
Admission: RE | Admit: 2023-05-28 | Discharge: 2023-05-28 | Disposition: A | Discharge status: Home / Self Care | Payer: BC Managed Care – PPO | Source: Ambulatory Visit | Attending: Family Medicine | Admitting: Family Medicine

## 2023-05-28 ENCOUNTER — Ambulatory Visit (INDEPENDENT_AMBULATORY_CARE_PROVIDER_SITE_OTHER): Payer: BC Managed Care – PPO | Admitting: Family Medicine

## 2023-05-28 ENCOUNTER — Ambulatory Visit
Admission: RE | Admit: 2023-05-28 | Discharge: 2023-05-28 | Disposition: A | Discharge status: Home / Self Care | Payer: BC Managed Care – PPO | Attending: Family Medicine | Admitting: Family Medicine

## 2023-05-28 VITALS — BP 120/76 | HR 92 | Ht 74.0 in | Wt 287.0 lb

## 2023-05-28 DIAGNOSIS — J209 Acute bronchitis, unspecified: Secondary | ICD-10-CM

## 2023-05-28 DIAGNOSIS — J189 Pneumonia, unspecified organism: Secondary | ICD-10-CM | POA: Diagnosis not present

## 2023-05-28 DIAGNOSIS — J45909 Unspecified asthma, uncomplicated: Secondary | ICD-10-CM | POA: Insufficient documentation

## 2023-05-28 DIAGNOSIS — R059 Cough, unspecified: Secondary | ICD-10-CM | POA: Diagnosis not present

## 2023-05-28 DIAGNOSIS — R509 Fever, unspecified: Secondary | ICD-10-CM | POA: Diagnosis not present

## 2023-05-28 MED ORDER — PROMETHAZINE-DM 6.25-15 MG/5ML PO SYRP: 5.0000 mL | ORAL_SOLUTION | Freq: Four times a day (QID) | ORAL | 0 refills | Status: AC | PRN

## 2023-05-28 MED ORDER — AMOXICILLIN-POT CLAVULANATE 875-125 MG PO TABS: 1.0000 | ORAL_TABLET | Freq: Two times a day (BID) | ORAL | 0 refills | Status: AC

## 2023-05-28 MED ORDER — IPRATROPIUM-ALBUTEROL 0.5-2.5 (3) MG/3ML IN SOLN: 3.0000 mL | Freq: Four times a day (QID) | RESPIRATORY_TRACT | 0 refills | Status: DC | PRN
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
## 2023-06-16 IMAGING — CR DG FINGER RING 2+V*R*
4 series · 4 of 4 positions shown · non-contrast
Comparison: Right ring finger 03/03/2022

CLINICAL DATA: Follow-up middle phalanx proximal aspect fracture

EXAM:
RIGHT RING FINGER 2+V

[finger ap]
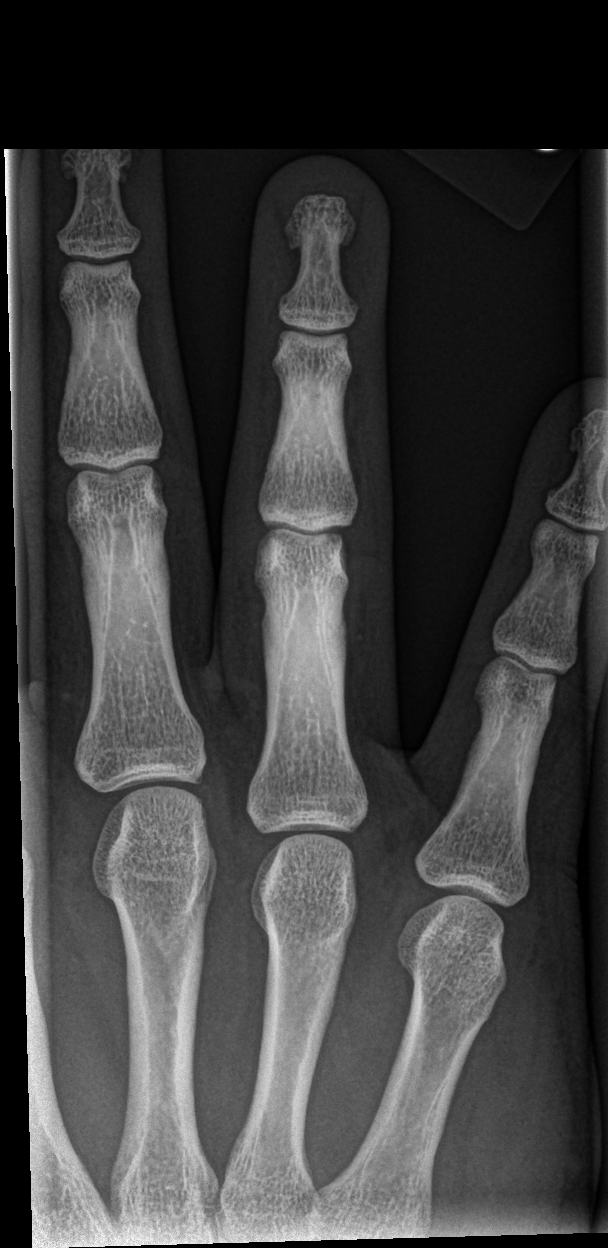

[finger obl]
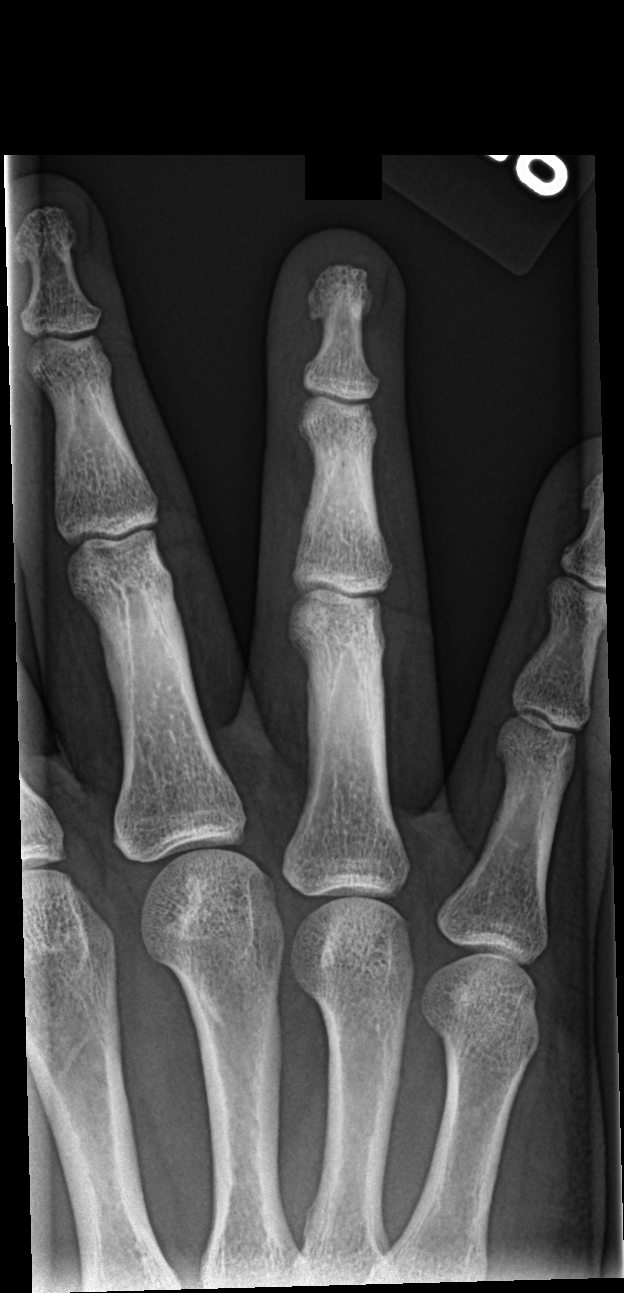

[finger lat (1 of 2)]
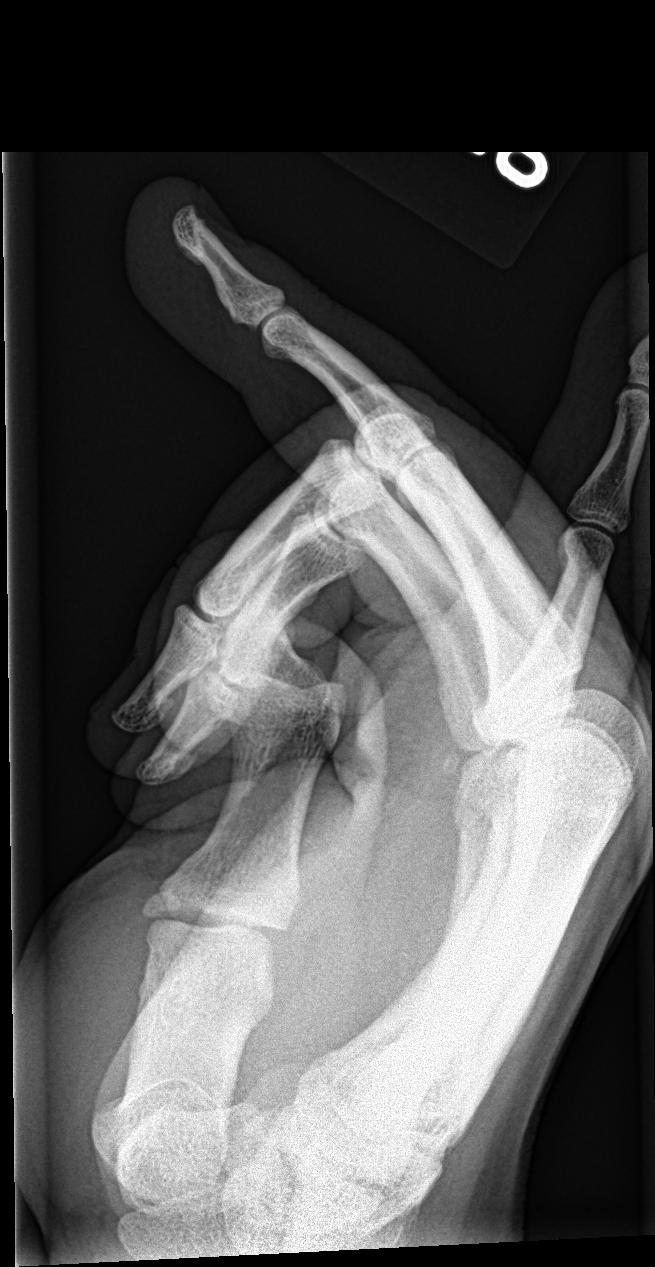

[finger lat (2 of 2)]
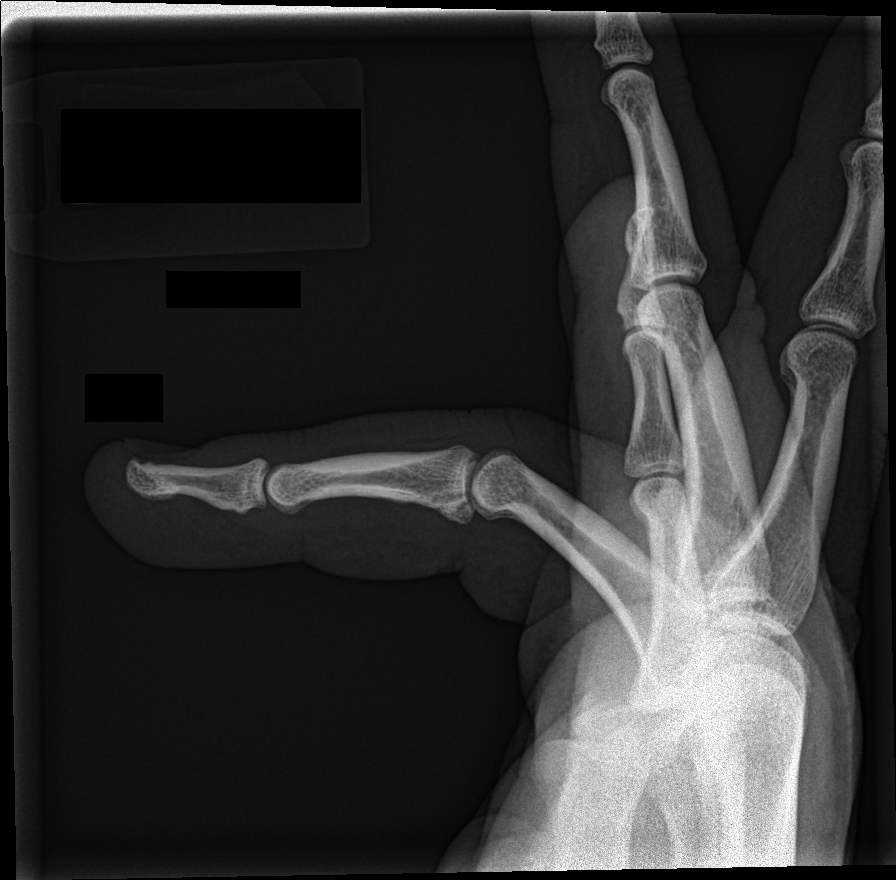

[4 of 4 positions shown; findings below may reference images not displayed]

FINDINGS: Redemonstration of linear lucency within the volar base of the
middle phalanx of the fourth finger with intra-articular extension.
New mild healing sclerosis. This is best seen on lateral view. Joint
spaces are preserved.
IMPRESSION: Mild early healing of nondisplaced intra-articular fracture of the
volar base of the middle phalanx of the ring finger.

## 2023-07-20 ENCOUNTER — Encounter: Payer: BC Managed Care – PPO | Admitting: Family Medicine

## 2024-06-18 ENCOUNTER — Emergency Department
Admission: EM | Admit: 2024-06-18 | Discharge: 2024-06-19 | Disposition: A | Discharge status: Home / Self Care | Attending: Emergency Medicine | Admitting: Emergency Medicine

## 2024-06-18 ENCOUNTER — Emergency Department

## 2024-06-18 ENCOUNTER — Other Ambulatory Visit: Payer: Self-pay

## 2024-06-18 ENCOUNTER — Encounter: Payer: Self-pay | Admitting: Emergency Medicine

## 2024-06-18 DIAGNOSIS — I10 Essential (primary) hypertension: Secondary | ICD-10-CM | POA: Diagnosis not present

## 2024-06-18 DIAGNOSIS — R944 Abnormal results of kidney function studies: Secondary | ICD-10-CM | POA: Diagnosis not present

## 2024-06-18 DIAGNOSIS — T679XXA Effect of heat and light, unspecified, initial encounter: Secondary | ICD-10-CM | POA: Diagnosis present

## 2024-06-18 DIAGNOSIS — X30XXXA Exposure to excessive natural heat, initial encounter: Secondary | ICD-10-CM | POA: Insufficient documentation

## 2024-06-18 LAB — CBC WITH DIFFERENTIAL/PLATELET
Abs Immature Granulocytes: 0.04 K/uL (ref 0.00–0.07)
Basophils Absolute: 0 K/uL (ref 0.0–0.1)
Basophils Relative: 0 %
Eosinophils Absolute: 0 K/uL (ref 0.0–0.5)
Eosinophils Relative: 0 %
HCT: 44.1 % (ref 39.0–52.0)
Hemoglobin: 15.3 g/dL (ref 13.0–17.0)
Immature Granulocytes: 0 %
Lymphocytes Relative: 12 %
Lymphs Abs: 1.1 K/uL (ref 0.7–4.0)
MCH: 28.9 pg (ref 26.0–34.0)
MCHC: 34.7 g/dL (ref 30.0–36.0)
MCV: 83.4 fL (ref 80.0–100.0)
Monocytes Absolute: 0.5 K/uL (ref 0.1–1.0)
Monocytes Relative: 5 %
Neutro Abs: 7.5 K/uL (ref 1.7–7.7)
Neutrophils Relative %: 83 %
Platelets: 259 K/uL (ref 150–400)
RBC: 5.29 MIL/uL (ref 4.22–5.81)
RDW: 12.7 % (ref 11.5–15.5)
WBC: 9.1 K/uL (ref 4.0–10.5)
nRBC: 0 % (ref 0.0–0.2)

## 2024-06-18 LAB — URINALYSIS, ROUTINE W REFLEX MICROSCOPIC
Bilirubin Urine: NEGATIVE
Glucose, UA: NEGATIVE mg/dL
Hgb urine dipstick: NEGATIVE
Ketones, ur: 5 mg/dL — AB
Leukocytes,Ua: NEGATIVE
Nitrite: NEGATIVE
Protein, ur: NEGATIVE mg/dL
Specific Gravity, Urine: 1.019 (ref 1.005–1.030)
pH: 6 (ref 5.0–8.0)

## 2024-06-18 LAB — COMPREHENSIVE METABOLIC PANEL WITH GFR
ALT: 59 U/L — ABNORMAL HIGH (ref 0–44)
AST: 34 U/L (ref 15–41)
Albumin: 4.6 g/dL (ref 3.5–5.0)
Alkaline Phosphatase: 56 U/L (ref 38–126)
Anion gap: 13 (ref 5–15)
BUN: 17 mg/dL (ref 6–20)
CO2: 21 mmol/L — ABNORMAL LOW (ref 22–32)
Calcium: 9.3 mg/dL (ref 8.9–10.3)
Chloride: 103 mmol/L (ref 98–111)
Creatinine, Ser: 1.28 mg/dL — ABNORMAL HIGH (ref 0.61–1.24)
GFR, Estimated: >60 mL/min (ref >60)
Glucose, Bld: 106 mg/dL — ABNORMAL HIGH (ref 70–99)
Potassium: 3.8 mmol/L (ref 3.5–5.1)
Sodium: 137 mmol/L (ref 135–145)
Total Bilirubin: 1.3 mg/dL — ABNORMAL HIGH (ref 0.0–1.2)
Total Protein: 8.2 g/dL — ABNORMAL HIGH (ref 6.5–8.1)

## 2024-06-18 LAB — CK: Total CK: 201 U/L (ref 49–397)

## 2024-06-18 LAB — LACTIC ACID, PLASMA: Lactic Acid, Venous: 1 mmol/L (ref 0.5–1.9)

## 2024-06-18 MED ORDER — KETOROLAC TROMETHAMINE 15 MG/ML IJ SOLN
15.0000 mg | Freq: Once | INTRAMUSCULAR | Status: AC
  Administered 2024-06-18: 15 mg via INTRAVENOUS

## 2024-06-18 MED ORDER — SODIUM CHLORIDE 0.9 % IV BOLUS
2000.0000 mL | Freq: Once | INTRAVENOUS | Status: AC
  Administered 2024-06-18: 2000 mL via INTRAVENOUS

## 2024-06-18 MED ORDER — DIPHENHYDRAMINE HCL 50 MG/ML IJ SOLN
12.5000 mg | Freq: Once | INTRAMUSCULAR | Status: AC
  Administered 2024-06-18: 12.5 mg via INTRAVENOUS
  Filled 2024-06-18: qty 1

## 2024-06-18 MED ORDER — ONDANSETRON HCL 4 MG/2ML IJ SOLN
4.0000 mg | Freq: Once | INTRAMUSCULAR | Status: AC
  Administered 2024-06-18: 4 mg via INTRAVENOUS
  Filled 2024-06-18: qty 2

## 2024-06-18 MED ORDER — ACETAMINOPHEN 500 MG PO TABS
1000.0000 mg | ORAL_TABLET | Freq: Once | ORAL | Status: AC
Start: 2024-06-18 — End: 2024-06-18
  Administered 2024-06-18: 1000 mg via ORAL
  Filled 2024-06-18: qty 2

## 2024-06-18 MED ORDER — LACTATED RINGERS IV BOLUS
1000.0000 mL | Freq: Once | INTRAVENOUS | Status: AC
  Administered 2024-06-18: 1000 mL via INTRAVENOUS

## 2024-06-18 MED ORDER — METOCLOPRAMIDE HCL 5 MG/ML IJ SOLN
10.0000 mg | Freq: Once | INTRAMUSCULAR | Status: AC
  Administered 2024-06-18: 10 mg via INTRAVENOUS
  Filled 2024-06-18: qty 2

## 2024-06-18 MED ORDER — KETOROLAC TROMETHAMINE 15 MG/ML IJ SOLN
15.0000 mg | Freq: Once | INTRAMUSCULAR | Status: DC
  Filled 2024-06-18: qty 1

## 2024-06-18 NOTE — ED Notes (Signed)
 Spoke to [place], MD regarding pt. Verbal order to 2L NaCl given at this time.

## 2024-06-18 NOTE — Discharge Instructions (Addendum)
 The CT scan of your head was normal.  Your blood work was normal aside from a mild kidney injury due to you being dehydrated.  Please make sure you drink lots of water  and eat regularly.  Your headache may be the result of a migraine or due to the dehydration.  Below are options for primary care providers in the area.  Please go to the following website to schedule new (and existing) patient appointments:   [email or web address]   The following is a list of primary care offices in the area who are accepting new patients at this time.  Please reach out to one of them directly and let them know you would like to schedule an appointment to follow up on an Emergency Department visit, and/or to establish a new primary care provider (PCP).  There are likely other primary care clinics in the are who are accepting new patients, but this is an excellent place to start:  [facility] Lead physician: Dr Jon Eva [address] [place], [place] [address] [phone]  [facility] Lead Physician: Dr Dorette Loron [address], [place], [place] [address] [phone]  [facility]  Lead Physician: Dr Duwaine Louder [address] [place], [place] [address] [phone]  [facility] Lead Physician: Dr Marolyn Officer [address], [place], [place] [address] [phone]  [facility] at [facility] Lead Physician: Dr Leita Adie [address] [place], [place], [place] [address] [phone]

## 2024-06-18 NOTE — ED Notes (Signed)
 PA, Lauren at bedside; update provided to patient.

## 2024-06-18 NOTE — ED Provider Notes (Signed)
 [facility] Provider Note    Event Date/Time   First MD Initiated Contact with Patient 06/18/24 1928     (approximate)   History   Heat Exposure   HPI  Ryan Pennington is a 28 y.o. male with PMH of GERD and hypertension presents for evaluation of heat exposure.  Patient has been outside working in the heat all week.  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  He drank fluids and electrolytes throughout the day.  He says that he has had a migraine for the past couple days that does go away when he takes Advil  dual action but then returns as soon as the medication wears off.      Physical Exam   Triage Vital Signs: ED Triage Vitals  Encounter Vitals Group     BP 06/18/24 1814 (!) 152/100     Girls Systolic BP Percentile --      Girls Diastolic BP Percentile --      Boys Systolic BP Percentile --      Boys Diastolic BP Percentile --      Pulse Rate 06/18/24 1814 (!) 134     Resp 06/18/24 1814 18     Temp 06/18/24 1814 (!) 100.9 F (38.3 C)     Temp Source 06/18/24 1814 Oral     SpO2 06/18/24 1814 97 %     Weight 06/18/24 1815 (!) 303 lb (137.4 kg)     Height 06/18/24 1815 6' 1 (1.854 m)     Head Circumference --      Peak Flow --      Pain Score 06/18/24 1815 7     Pain Loc --      Pain Education --      Exclude from Growth Chart --     Most recent vital signs: Vitals:   06/18/24 2107 06/18/24 2359  BP: (!) 158/75   Pulse: (!) 117 (!) 104  Resp: 16   Temp: 98.2 F (36.8 C)   SpO2: 96%    General: Awake, no distress.  CV:  Good peripheral perfusion.  Regular rhythm but tachycardic Resp:  Normal effort.  CTAB Abd:  No distention.  Other:  Small amount of sweat on the face.   ED Results / Procedures / Treatments   Labs (all labs ordered are listed, but only abnormal results are displayed) Labs Reviewed  COMPREHENSIVE METABOLIC PANEL WITH GFR - Abnormal; Notable for the following components:       Result Value   CO2 21 (*)    Glucose, Bld 106 (*)    Creatinine, Ser 1.28 (*)    Total Protein 8.2 (*)    ALT 59 (*)    Total Bilirubin 1.3 (*)    All other components within normal limits  URINALYSIS, ROUTINE W REFLEX MICROSCOPIC - Abnormal; Notable for the following components:   Color, Urine YELLOW (*)    APPearance CLEAR (*)    Ketones, ur 5 (*)    All other components within normal limits  LACTIC ACID, PLASMA  CBC WITH DIFFERENTIAL/PLATELET  CK  LACTIC ACID, PLASMA      PROCEDURES:  Critical Care performed: No  Procedures   MEDICATIONS ORDERED IN ED: Medications  sodium chloride  0.9 % bolus 2,000 mL (0 mLs Intravenous Stopped 06/18/24 2045)  ondansetron  (ZOFRAN ) injection 4 mg (4 mg Intravenous Given 06/18/24 2102)  ketorolac  (TORADOL ) 15 MG/ML injection 15 mg (15 mg Intravenous  Given 06/18/24 2102)  metoCLOPramide  (REGLAN ) injection 10 mg (10 mg Intravenous Given 06/18/24 2220)  diphenhydrAMINE  (BENADRYL ) injection 12.5 mg (12.5 mg Intravenous Given 06/18/24 2219)  acetaminophen  (TYLENOL ) tablet 1,000 mg (1,000 mg Oral Given 06/18/24 2219)  lactated ringers  bolus 1,000 mL (1,000 mLs Intravenous New Bag/Given 06/18/24 2300)     IMPRESSION / MDM / ASSESSMENT AND PLAN / ED COURSE  I reviewed the triage vital signs and the nursing notes.                             28 year old male presents for evaluation of heat exposure.  Patient was hypertensive, febrile and tachycardic on presentation.  He appears warm and uncomfortable on exam.  Differential diagnosis includes, but is not limited to, dehydration, heatstroke, electrolyte abnormality, AKI, rhabdomyolysis.  Patient's presentation is most consistent with acute complicated illness / injury requiring diagnostic workup.  CBC and lactic are unremarkable.  CK is normal.  CMP shows mildly elevated creatinine otherwise unremarkable.  Will treat headache with toradol  and nausea with zofran .   Patient reports he is still  having a headache and doesn't feel any better. After 2 L of fluids patient is still tachycardic. Will give him a liter of LR, benadryl , tylenol , and reglan . He also reports his headache has been present since Saturday, so will obtain a CT scan to rule out other pathologies.   CT scan is negative.   11:25 pm: Patient reporting his symptoms have improved. Still tachycardic but has improved from initial presentation and has half a bag of LR left. Feel patient would be stable for discharge after finishing the fluids.   11:59 PM: Patient reassessed and continues to feel better.  States he is ready to go home and the fluids are completed now.  Tachycardia continues to improve.      FINAL CLINICAL IMPRESSION(S) / ED DIAGNOSES   Final diagnoses:  Heat exposure, initial encounter     Rx / DC Orders   ED Discharge Orders     None        Note:  This document was prepared using Dragon voice recognition software and may include unintentional dictation errors.   Cleaster Tinnie LABOR, PA-C 06/19/24 0000    Viviann Pastor, MD 06/19/24 2253

## 2024-06-18 NOTE — ED Notes (Signed)
 Patient reports headache is unchanged, still 10/10.  States, It feels like my head is going to split open.  PA, Lauren made aware.

## 2024-06-18 NOTE — ED Triage Notes (Signed)
 Patient to ED via POV for heat exposure. 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 Had a migraine since 12. Took advil  at 4pm. States he drank fluids and electrolytes today.

## 2024-06-18 NOTE — ED Notes (Signed)
 Patient transported to CT
